# Patient Record
Sex: Female | Born: 1955 | Race: White | Hispanic: No | Smoking: Never smoker
Health system: Southern US, Community
[De-identification: ages and names within clinical notes are randomized; demographics above are authoritative.]

## PROBLEM LIST (undated history)

## (undated) DIAGNOSIS — N904 Leukoplakia of vulva: Secondary | ICD-10-CM

## (undated) DIAGNOSIS — N950 Postmenopausal bleeding: Secondary | ICD-10-CM

## (undated) DIAGNOSIS — L509 Urticaria, unspecified: Secondary | ICD-10-CM

## (undated) DIAGNOSIS — D219 Benign neoplasm of connective and other soft tissue, unspecified: Secondary | ICD-10-CM

## (undated) HISTORY — DX: Benign neoplasm of connective and other soft tissue, unspecified: D21.9

## (undated) HISTORY — DX: Leukoplakia of vulva: N90.4

## (undated) HISTORY — PX: WISDOM TOOTH EXTRACTION: SHX21

## (undated) HISTORY — DX: Urticaria, unspecified: L50.9

## (undated) HISTORY — PX: OTHER SURGICAL HISTORY: SHX169

---

## 1999-03-03 ENCOUNTER — Other Ambulatory Visit: Admission: RE | Admit: 1999-03-03 | Discharge: 1999-03-03 | Payer: Self-pay | Admitting: Obstetrics and Gynecology

## 2000-03-02 ENCOUNTER — Other Ambulatory Visit: Admission: RE | Admit: 2000-03-02 | Discharge: 2000-03-02 | Payer: Self-pay | Admitting: Obstetrics and Gynecology

## 2000-03-16 ENCOUNTER — Ambulatory Visit (HOSPITAL_COMMUNITY): Admission: RE | Admit: 2000-03-16 | Discharge: 2000-03-16 | Payer: Self-pay | Admitting: Obstetrics and Gynecology

## 2000-03-16 ENCOUNTER — Encounter: Payer: Self-pay | Admitting: Obstetrics and Gynecology

## 2001-03-06 ENCOUNTER — Other Ambulatory Visit: Admission: RE | Admit: 2001-03-06 | Discharge: 2001-03-06 | Payer: Self-pay | Admitting: Obstetrics and Gynecology

## 2001-04-24 ENCOUNTER — Encounter: Payer: Self-pay | Admitting: Obstetrics and Gynecology

## 2001-04-24 ENCOUNTER — Ambulatory Visit (HOSPITAL_COMMUNITY): Admission: RE | Admit: 2001-04-24 | Discharge: 2001-04-24 | Payer: Self-pay | Admitting: Obstetrics and Gynecology

## 2001-05-02 ENCOUNTER — Ambulatory Visit (HOSPITAL_BASED_OUTPATIENT_CLINIC_OR_DEPARTMENT_OTHER): Admission: RE | Admit: 2001-05-02 | Discharge: 2001-05-02 | Payer: Self-pay | Admitting: Orthopedic Surgery

## 2002-04-03 ENCOUNTER — Other Ambulatory Visit: Admission: RE | Admit: 2002-04-03 | Discharge: 2002-04-03 | Payer: Self-pay | Admitting: Obstetrics and Gynecology

## 2002-04-29 ENCOUNTER — Ambulatory Visit (HOSPITAL_COMMUNITY): Admission: RE | Admit: 2002-04-29 | Discharge: 2002-04-29 | Payer: Self-pay | Admitting: Obstetrics and Gynecology

## 2002-04-29 ENCOUNTER — Encounter: Payer: Self-pay | Admitting: Obstetrics and Gynecology

## 2003-04-22 ENCOUNTER — Other Ambulatory Visit: Admission: RE | Admit: 2003-04-22 | Discharge: 2003-04-22 | Payer: Self-pay | Admitting: Obstetrics and Gynecology

## 2003-05-12 ENCOUNTER — Ambulatory Visit (HOSPITAL_COMMUNITY): Admission: RE | Admit: 2003-05-12 | Discharge: 2003-05-12 | Payer: Self-pay | Admitting: Obstetrics and Gynecology

## 2004-05-18 ENCOUNTER — Other Ambulatory Visit: Admission: RE | Admit: 2004-05-18 | Discharge: 2004-05-18 | Payer: Self-pay | Admitting: Obstetrics and Gynecology

## 2004-07-12 ENCOUNTER — Ambulatory Visit (HOSPITAL_COMMUNITY): Admission: RE | Admit: 2004-07-12 | Discharge: 2004-07-12 | Payer: Self-pay | Admitting: Obstetrics and Gynecology

## 2005-06-09 ENCOUNTER — Other Ambulatory Visit: Admission: RE | Admit: 2005-06-09 | Discharge: 2005-06-09 | Payer: Self-pay | Admitting: Obstetrics and Gynecology

## 2005-12-05 ENCOUNTER — Ambulatory Visit (HOSPITAL_COMMUNITY): Admission: RE | Admit: 2005-12-05 | Discharge: 2005-12-05 | Payer: Self-pay | Admitting: Obstetrics and Gynecology

## 2006-12-10 ENCOUNTER — Ambulatory Visit (HOSPITAL_COMMUNITY): Admission: RE | Admit: 2006-12-10 | Discharge: 2006-12-10 | Payer: Self-pay | Admitting: Family Medicine

## 2007-12-11 ENCOUNTER — Ambulatory Visit (HOSPITAL_COMMUNITY): Admission: RE | Admit: 2007-12-11 | Discharge: 2007-12-11 | Payer: Self-pay | Admitting: Family Medicine

## 2008-12-14 ENCOUNTER — Ambulatory Visit (HOSPITAL_COMMUNITY): Admission: RE | Admit: 2008-12-14 | Discharge: 2008-12-14 | Payer: Self-pay | Admitting: Obstetrics and Gynecology

## 2009-07-29 ENCOUNTER — Encounter (INDEPENDENT_AMBULATORY_CARE_PROVIDER_SITE_OTHER): Payer: Self-pay | Admitting: *Deleted

## 2009-10-07 ENCOUNTER — Encounter (INDEPENDENT_AMBULATORY_CARE_PROVIDER_SITE_OTHER): Payer: Self-pay | Admitting: *Deleted

## 2009-10-08 ENCOUNTER — Ambulatory Visit: Payer: Self-pay | Admitting: Gastroenterology

## 2009-10-11 ENCOUNTER — Telehealth: Payer: Self-pay | Admitting: Gastroenterology

## 2009-10-25 ENCOUNTER — Ambulatory Visit: Payer: Self-pay | Admitting: Gastroenterology

## 2009-12-16 ENCOUNTER — Ambulatory Visit (HOSPITAL_COMMUNITY): Admission: RE | Admit: 2009-12-16 | Discharge: 2009-12-16 | Payer: Self-pay | Admitting: Obstetrics and Gynecology

## 2010-02-15 NOTE — Procedures (Signed)
Summary: Colonoscopy  Patient: [name] Note: All result statuses are Final unless otherwise noted.  Tests: (1) Colonoscopy (COL)   COL Colonoscopy           DONE     [facility]     [address]     [place], [place]  [address]           COLONOSCOPY PROCEDURE REPORT           PATIENT:  Natasha Simmons, Natasha Simmons  MR#:  [id number]     BIRTHDATE:  07-04-1955, 54 yrs. old  GENDER:  female           ENDOSCOPIST:  Barbette Hair. Arlyce Dice, MD     Referred by:           PROCEDURE DATE:  10/25/2009     PROCEDURE:  Diagnostic Colonoscopy     ASA CLASS:  Class I     INDICATIONS:  1) Routine Risk Screening           MEDICATIONS:   Fentanyl 100 mcg IV, Versed 10 mg IV, Benadryl 25     mg IV           DESCRIPTION OF PROCEDURE:   After the risks benefits and     alternatives of the procedure were thoroughly explained, informed     consent was obtained.  Digital rectal exam was performed and     revealed no abnormalities.   The LB CF-[id number] endoscope     was introduced through the anus and advanced to the cecum, which     was identified by both the appendix and ileocecal valve, without     limitations.  The quality of the prep was .  The instrument was     then slowly withdrawn as the colon was fully examined.     <<PROCEDUREIMAGES>>           FINDINGS:  Internal hemorrhoids were found (see image11).  This     was otherwise a normal examination of the colon (see image1,     image2, image3, image4, image7, image8, image9, and image10).     Retroflexed views in the rectum revealed no abnormalities.    The     time to cecum =  6.50  minutes. The scope was then withdrawn (time     =  6.25  min) from the patient and the procedure completed.           COMPLICATIONS:  None           ENDOSCOPIC IMPRESSION:     1) Internal hemorrhoids     2) Otherwise normal examination     RECOMMENDATIONS:     1) Continue current colorectal screening recommendations for     "routine risk" patients with  a repeat colonoscopy in 10 years.           REPEAT EXAM:  In 10 year(s) for Colonoscopy.           ______________________________     Barbette Hair. Arlyce Dice, MD           CC: Benedetto Goad, MD, Meredeth Ide, MD           n.     Rosalie Doctor:   Barbette Hair. Kaplan at 10/25/2009 04:07 PM           Karin Lieu, [id number]  Note: An exclamation mark (!) indicates a result that was not dispersed into the flowsheet. Document Creation Date: 10/25/2009 4:08 PM  _______________________________________________________________________  (1) Order result status: Final Collection or observation date-time: 10/25/2009 16:02 Requested date-time:  Receipt date-time:  Reported date-time:  Referring Physician:   Ordering Physician: Melvia Heaps [phone]) Specimen Source:  Source: Launa Grill Order Number: [id number] Lab site:   Appended Document: Colonoscopy    Clinical Lists Changes  Observations: Added new observation of COLONNXTDUE: 10/2019 (10/25/2009 16:15)

## 2010-02-15 NOTE — Miscellaneous (Signed)
Summary: [facility] Previsit/prep  Clinical Lists Changes  Medications: Added new medication of MOVIPREP 100 GM  SOLR (PEG-KCL-NACL-NASULF-NA ASC-C) As per prep instructions. - Signed Rx of MOVIPREP 100 GM  SOLR (PEG-KCL-NACL-NASULF-NA ASC-C) As per prep instructions.;  #1 x 0;  Signed;  Entered by: Wyona Almas RN;  Authorized by: Louis Meckel MD;  Method used: Electronically to [facility]  [phone]*, [address] [place], [place], [place]  [address], Ph: [phone] or [phone], Fax: [phone] Allergies: Added new allergy or adverse reaction of * SHRIMP Observations: Added new observation of NKA: F (10/08/2009 13:16)    Prescriptions: MOVIPREP 100 GM  SOLR (PEG-KCL-NACL-NASULF-NA ASC-C) As per prep instructions.  #1 x 0   Entered by:   Wyona Almas RN   Authorized by:   Louis Meckel MD   Signed by:   Wyona Almas RN on 10/08/2009   Method used:   Electronically to        [facility]  [phone]* (retail)       [address]       [place], [place]  [address]       Ph: [phone] or [phone]       Fax: [phone]   RxID:   [id number]

## 2010-02-15 NOTE — Progress Notes (Signed)
Summary: Reschedule pt  Phone Note Outgoing Call Call back at [facility] Phone [phone]   Call placed by: Merri Ray CMA Duncan Dull),  October 11, 2009 1:03 PM Summary of Call: Called pt to inform that we need to r/s her procedure for 10/22/2009 due to hospital case. Waiting on pt to call back to r/s Initial call taken by: Merri Ray CMA Duncan Dull),  October 11, 2009 1:03 PM  Follow-up for Phone Call        Called pt back and left another message for pt to return my call so we can get this pt rescheduled for Dr Nita Sells hospital case Follow-up by: Merri Ray CMA Duncan Dull),  October 12, 2009 9:23 AM  Additional Follow-up for Phone Call Additional follow up Details #1::        Pt returned call, resceduled pt for 10/25/2009 at 2:30pm. Went over new times with pt. Told pt to call back as needed  Additional Follow-up by: Merri Ray CMA Duncan Dull),  October 12, 2009 4:40 PM

## 2010-02-15 NOTE — Letter (Signed)
Summary: [facility] Instructions  [facility]  [address] [place], [place] [address]   Phone: [phone]  Fax: [phone]       Natasha Simmons    [birth year]    MRN: [id number]        Procedure Day Dorna Bloom:  Farrell Ours  10/22/09     Arrival Time:  1:00PM     Procedure Time:  2:00PM     Location of Procedure:                    _X _  [facility] (4th Floor)                       PREPARATION FOR COLONOSCOPY WITH MOVIPREP   Starting 5 days prior to your procedure 10/17/09 do not eat nuts, seeds, popcorn, corn, beans, peas,  salads, or any raw vegetables.  Do not take any fiber supplements (e.g. Metamucil, Citrucel, and Benefiber).  THE DAY BEFORE YOUR PROCEDURE         DATE: 10/21/09  DAY: THURSDAY  1.  Drink clear liquids the entire day-NO SOLID FOOD  2.  Do not drink anything colored red or purple.  Avoid juices with pulp.  No orange juice.  3.  Drink at least 64 oz. (8 glasses) of fluid/clear liquids during the day to prevent dehydration and help the prep work efficiently.  CLEAR LIQUIDS INCLUDE: Water Jello Ice Popsicles Tea (sugar ok, no milk/cream) Powdered fruit flavored drinks Coffee (sugar ok, no milk/cream) Gatorade Juice: apple, white grape, white cranberry  Lemonade Clear bullion, consomm, broth Carbonated beverages (any kind) Strained chicken noodle soup Hard Candy                             4.  In the morning, mix first dose of MoviPrep solution:    Empty 1 Pouch A and 1 Pouch B into the disposable container    Add lukewarm drinking water to the top line of the container. Mix to dissolve    Refrigerate (mixed solution should be used within 24 hrs)  5.  Begin drinking the prep at 5:00 p.m. The MoviPrep container is divided by 4 marks.   Every 15 minutes drink the solution down to the next mark (approximately 8 oz) until the full liter is complete.   6.  Follow completed prep with 16 oz of clear liquid of your choice (Nothing  red or purple).  Continue to drink clear liquids until bedtime.  7.  Before going to bed, mix second dose of MoviPrep solution:    Empty 1 Pouch A and 1 Pouch B into the disposable container    Add lukewarm drinking water to the top line of the container. Mix to dissolve    Refrigerate  THE DAY OF YOUR PROCEDURE      DATE: 10/22/09  DAY: FRIDAY  Beginning at 9:00AM (5 hours before procedure):         1. Every 15 minutes, drink the solution down to the next mark (approx 8 oz) until the full liter is complete.  2. Follow completed prep with 16 oz. of clear liquid of your choice.    3. You may drink clear liquids until 12:00PM (2 HOURS BEFORE PROCEDURE).   MEDICATION INSTRUCTIONS  Unless otherwise instructed, you should take regular prescription medications with a small sip of water   as early as possible the morning of  your procedure.        OTHER INSTRUCTIONS  You will need a responsible adult at least 53 years of age to accompany you and drive you home.   This person must remain in the waiting room during your procedure.  Wear loose fitting clothing that is easily removed.  Leave jewelry and other valuables at home.  However, you may wish to bring a book to read or  an iPod/MP3 player to listen to music as you wait for your procedure to start.  Remove all body piercing jewelry and leave at home.  Total time from sign-in until discharge is approximately 2-3 hours.  You should go home directly after your procedure and rest.  You can resume normal activities the  day after your procedure.  The day of your procedure you should not:   Drive   Make legal decisions   Operate machinery   Drink alcohol   Return to work  You will receive specific instructions about eating, activities and medications before you leave.    The above instructions have been reviewed and explained to me by   Wyona Almas RN  October 08, 2009 2:10 PM     I fully understand and  can verbalize these instructions _____________________________ Date _________

## 2010-02-15 NOTE — Letter (Signed)
Summary: Previsit letter  [facility]  [address] [place], [place] [address]   Phone: [phone]  Fax: [phone]       07/29/2009 MRN: [id number]  [facility] [address] [place], [place]  [address]  Dear Ms. Yaklin,  Welcome to the Gastroenterology Division at [facility].    You are scheduled to see a nurse for your pre-procedure visit on 09-16-09 at 4:00p.m. on the 3rd floor at [facility], [address]  We ask that you try to arrive at our office 15 minutes prior to your appointment time to allow for check-in.  Your nurse visit will consist of discussing your medical and surgical history, your immediate family medical history, and your medications.    Please bring a complete list of all your medications or, if you prefer, bring the medication bottles and we will list them.  We will need to be aware of both prescribed and over the counter drugs.  We will need to know exact dosage information as well.  If you are on blood thinners (Coumadin, Plavix, Aggrenox, Ticlid, etc.) please call our office today/prior to your appointment, as we need to consult with your physician about holding your medication.   Please be prepared to read and sign documents such as consent forms, a financial agreement, and acknowledgement forms.  If necessary, and with your consent, a friend or relative is welcome to sit-in on the nurse visit with you.  Please bring your insurance card so that we may make a copy of it.  If your insurance requires a referral to see a specialist, please bring your referral form from your primary care physician.  No co-pay is required for this nurse visit.     If you cannot keep your appointment, please call [phone] to cancel or reschedule prior to your appointment date.  This allows [place] the opportunity to schedule an appointment for another patient in need of care.    Thank you for choosing [facility] for your medical  needs.  We appreciate the opportunity to care for you.  Please visit [place] at our website  to learn more about our practice.                     Sincerely.                                                                                                                   The Gastroenterology Division

## 2010-06-03 NOTE — Op Note (Signed)
[place]  Patient:    Natasha Simmons, Natasha Simmons Visit Number: [id number] MRN: [id number]          Service Type: DSU Location: [facility] Attending Physician:  Colbert Ewing Dictated by:   Loreta Ave, M.D. Proc. Date: 05/02/01 Admit Date:  05/02/2001 Discharge Date: 05/02/2001                             Operative Report  PREOPERATIVE DIAGNOSIS:  Inflammatory reaction, right knee, with underlying degenerative arthritis.  POSTOPERATIVE DIAGNOSES: 1. Grade 3 and 4 degenerative arthritis, right knee, with degenerative    tearing, medial and lateral meniscus. 2. Inflammatory synovitis. 3. Intra-articular fibrosis and adhesions from previous operative intervention    done in an open manner in the past.  PROCEDURES: 1. Right knee examination under anesthesia and arthroscopy, debridement of    synovitis, intra-articular adhesions, and fibrosis. 2. Partial medial and lateral meniscectomy. 3. Diffuse chondroplasty, especially lateral compartment.  SURGEON:  Loreta Ave, M.D.  ASSISTANT:  Arlys John D. Petrarca, P.A.-C.  ANESTHESIA:  General.  ESTIMATED BLOOD LOSS:  Minimal.  TOURNIQUET:  Not employed.  SPECIMENS:  None.  CULTURES:  None.  COMPLICATIONS:  None.  DRESSING:  Soft compressive.  DESCRIPTION OF PROCEDURE:  The patient brought to the operating room and placed on the operating table in supine position.  After adequate anesthesia had been obtained, right knee examined.  Full extension, 120 degrees of flexion, good stability, good alignment.  Tourniquet and leg holder applied. Leg prepped and draped in the usual sterile fashion.  Three portals created, one superolateral, one each medial and lateral parapatellar.  Inflow cannula introduced, the knee distended, and arthroscope introduced and knee inspected. A fair amount of adhesions and fibrinous debris throughout the knee.  All of this debrided.  Some reactive synovitis, but  the inflammatory component was not great.  Chondral loose bodies throughout.  Patellofemoral joint grade 3 changes patella, treated with chondroplasty.  Trochlea had grade 3 and some grade 4 changes centrally.  Good tracking.  Chondroplasty to both sides.  The medial compartment had some degenerative tearing medial meniscus anterior and middle third, saucerized out.  Inflammatory tissue debrided.  Only grade 2 changes medial compartment.  Cruciate ligaments intact.  Lateral compartment extensive tearing, complex nature, entire lateral meniscus of what was left after previous partial lateral meniscectomy.  Treated with near-total lateral meniscectomy, as there was nothing viable left to retain.  Grade 3 and some focal grade 4 changes, especially posteriorly.  At completion, the entire knee examined and no other significant findings appreciated.  The instruments and fluid removed.  Portals and knee injected with Marcaine.  Portals closed with 4-0 nylon.  Sterile compressive dressing applied.  Anesthesia reversed, brought to the recovery room.  Tolerated the surgery well.  No complications. Dictated by:   Loreta Ave, M.D. Attending Physician:  Colbert Ewing DD:  05/02/01 TD:  05/04/01 Job: [id number]

## 2010-11-10 ENCOUNTER — Other Ambulatory Visit: Payer: Self-pay | Admitting: Obstetrics and Gynecology

## 2010-11-10 DIAGNOSIS — Z1231 Encounter for screening mammogram for malignant neoplasm of breast: Secondary | ICD-10-CM

## 2010-12-19 ENCOUNTER — Ambulatory Visit (HOSPITAL_COMMUNITY)
Admission: RE | Admit: 2010-12-19 | Discharge: 2010-12-19 | Disposition: A | Discharge status: Home / Self Care | Payer: BC Managed Care – PPO | Source: Ambulatory Visit | Attending: Obstetrics and Gynecology | Admitting: Obstetrics and Gynecology

## 2010-12-19 DIAGNOSIS — Z1231 Encounter for screening mammogram for malignant neoplasm of breast: Secondary | ICD-10-CM | POA: Insufficient documentation

## 2011-11-21 ENCOUNTER — Other Ambulatory Visit (HOSPITAL_COMMUNITY): Payer: Self-pay | Admitting: Family Medicine

## 2011-11-21 DIAGNOSIS — Z1231 Encounter for screening mammogram for malignant neoplasm of breast: Secondary | ICD-10-CM

## 2011-12-21 ENCOUNTER — Ambulatory Visit (HOSPITAL_COMMUNITY): Payer: BC Managed Care – PPO

## 2012-01-09 ENCOUNTER — Ambulatory Visit (HOSPITAL_COMMUNITY)
Admission: RE | Admit: 2012-01-09 | Discharge: 2012-01-09 | Disposition: A | Discharge status: Home / Self Care | Payer: BC Managed Care – PPO | Source: Ambulatory Visit | Attending: Family Medicine | Admitting: Family Medicine

## 2012-01-09 DIAGNOSIS — Z1231 Encounter for screening mammogram for malignant neoplasm of breast: Secondary | ICD-10-CM | POA: Insufficient documentation

## 2012-04-02 ENCOUNTER — Ambulatory Visit: Payer: BC Managed Care – PPO | Admitting: Rehabilitation

## 2012-04-11 ENCOUNTER — Ambulatory Visit: Payer: BC Managed Care – PPO | Admitting: Rehabilitation

## 2012-05-16 ENCOUNTER — Ambulatory Visit: Payer: BC Managed Care – PPO | Attending: Physician Assistant | Admitting: Rehabilitation

## 2012-05-16 DIAGNOSIS — R293 Abnormal posture: Secondary | ICD-10-CM | POA: Insufficient documentation

## 2012-05-16 DIAGNOSIS — IMO0001 Reserved for inherently not codable concepts without codable children: Secondary | ICD-10-CM | POA: Insufficient documentation

## 2012-05-16 DIAGNOSIS — M2560 Stiffness of unspecified joint, not elsewhere classified: Secondary | ICD-10-CM | POA: Insufficient documentation

## 2012-05-16 DIAGNOSIS — M2550 Pain in unspecified joint: Secondary | ICD-10-CM | POA: Insufficient documentation

## 2012-05-23 ENCOUNTER — Ambulatory Visit: Payer: BC Managed Care – PPO | Admitting: Rehabilitation

## 2012-05-27 ENCOUNTER — Ambulatory Visit: Payer: BC Managed Care – PPO | Admitting: Physical Therapy

## 2012-05-30 ENCOUNTER — Ambulatory Visit: Payer: BC Managed Care – PPO | Admitting: Physical Therapy

## 2012-06-03 ENCOUNTER — Ambulatory Visit: Payer: BC Managed Care – PPO | Admitting: Physical Therapy

## 2012-06-05 ENCOUNTER — Ambulatory Visit: Payer: BC Managed Care – PPO | Admitting: Physical Therapy

## 2012-06-11 ENCOUNTER — Ambulatory Visit: Payer: BC Managed Care – PPO | Admitting: Rehabilitation

## 2012-06-13 ENCOUNTER — Encounter: Payer: BC Managed Care – PPO | Admitting: Rehabilitation

## 2012-07-26 ENCOUNTER — Other Ambulatory Visit: Payer: Self-pay | Admitting: Obstetrics and Gynecology

## 2012-07-29 ENCOUNTER — Telehealth: Payer: Self-pay | Admitting: *Deleted

## 2012-07-29 MED ORDER — ESTRADIOL 0.1 MG/24HR TD PTTW: 1.0000 | MEDICATED_PATCH | TRANSDERMAL | Status: DC

## 2012-07-29 NOTE — Telephone Encounter (Signed)
last aex-08/01/2011  next aex is on 08/05/2012. Refill on minivelle 0.1mg  patch #8/0 refills sent to pharmacy to maintain coverage until next aex.

## 2012-07-30 ENCOUNTER — Encounter: Payer: Self-pay | Admitting: Obstetrics and Gynecology

## 2012-08-05 ENCOUNTER — Encounter: Payer: Self-pay | Admitting: Obstetrics and Gynecology

## 2012-08-05 ENCOUNTER — Ambulatory Visit (INDEPENDENT_AMBULATORY_CARE_PROVIDER_SITE_OTHER): Payer: BC Managed Care – PPO | Admitting: Obstetrics and Gynecology

## 2012-08-05 VITALS — BP 112/58 | HR 76 | Resp 18 | Ht 65.0 in | Wt 129.0 lb

## 2012-08-05 DIAGNOSIS — Z01419 Encounter for gynecological examination (general) (routine) without abnormal findings: Secondary | ICD-10-CM

## 2012-08-05 DIAGNOSIS — N950 Postmenopausal bleeding: Secondary | ICD-10-CM

## 2012-08-05 DIAGNOSIS — Z0000 Encounter for general adult medical examination without abnormal findings: Secondary | ICD-10-CM

## 2012-08-05 LAB — POCT URINALYSIS DIPSTICK
Ketones, UA: NEGATIVE
Nitrite, UA: NEGATIVE

## 2012-08-05 MED ORDER — ESTRADIOL 0.1 MG/24HR TD PTTW: 1.0000 | MEDICATED_PATCH | TRANSDERMAL | Status: DC

## 2012-08-05 MED ORDER — PROGESTERONE MICRONIZED 100 MG PO CAPS: 100.0000 mg | ORAL_CAPSULE | Freq: Every day | ORAL | Status: DC

## 2012-08-05 NOTE — Patient Instructions (Signed)

## 2012-08-05 NOTE — Progress Notes (Signed)
57 y.o.   Divorced    Caucasian   female   G0P0000   here for annual exam.  Started on HRT last year d/t hot flashes and night sweats.  Having irregular bleeding, sometimes q2 weeks, sometimes lasting 2 weeks, sometimes heavy.    Patient's last menstrual period was 07/21/2012.          Sexually active: yes  The current method of family planning is none.    Exercising: aerobics, pilates 3-5 days a week, fitness walking, wt training Last mammogram: 11/2011 normal  Last pap smear:07/20/09 neg History of abnormal pap: no Smoking: no Alcohol: occ glass of wine Last colonoscopy:10/2009 normal, repeat in 10 years Last Bone Density:  never Last tetanus shot: 2007 Last cholesterol check: not sure  Hgb:  12.8              Urine: neg   Family History  Problem Relation Age of Onset  . Thyroid disease Mother   . Hypertension Mother   . Diabetes Father   . Heart disease Father   . Liver disease Father   . Lymphoma Father     There are no active problems to display for this patient.   History reviewed. No pertinent past medical history.  Past Surgical History  Procedure Laterality Date  . Knee surgery  1976; [phone]    6 knee surgeries total    Allergies: Shrimp  Current Outpatient Prescriptions  Medication Sig Dispense Refill  . cholecalciferol (VITAMIN D) 1000 UNITS tablet Take 1,000 Units by mouth daily.      Marland Kitchen MINIVELLE 0.1 MG/24HR APPLY 1 PATCH TWICE WEEKLY  8 patch  11  . Multiple Vitamins-Minerals (MULTIVITAMIN PO) Take by mouth daily.       . Omega-3 Fatty Acids (FISH OIL PO) Take by mouth.      . progesterone (PROMETRIUM) 100 MG capsule        No current facility-administered medications for this visit.    ROS: Pertinent items are noted in HPI.  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  Exam:    BP 112/58  Pulse 76  Resp 18  Ht 5\' 5"  (1.651 m)  Wt 129 lb (58.514 kg)  BMI 21.47 kg/m2  LMP 07/21/2012  Ht stable and wt  down 4 pounds from last year Wt Readings from Last 3 Encounters:  08/05/12 129 lb (58.514 kg)     Ht Readings from Last 3 Encounters:  08/05/12 5\' 5"  (1.651 m)    General appearance: alert, cooperative and appears stated age Head: Normocephalic, without obvious abnormality, atraumatic Neck: no adenopathy, supple, symmetrical, trachea midline and thyroid not enlarged, symmetric, no tenderness/mass/nodules Lungs: clear to auscultation bilaterally Breasts: Inspection negative, No nipple retraction or dimpling, No nipple discharge or bleeding, No axillary or supraclavicular adenopathy, Normal to palpation without dominant masses Heart: regular rate and rhythm Abdomen: soft, non-tender; bowel sounds normal; no masses,  no organomegaly Extremities: extremities normal, atraumatic, no cyanosis or edema Skin: Skin color, texture, turgor normal. No rashes or lesions Lymph nodes: Cervical, supraclavicular, and axillary nodes normal. No abnormal inguinal nodes palpated Neurologic: Grossly normal   Pelvic: External genitalia:  no lesions              Urethra:  normal appearing urethra with no masses, tenderness or lesions              Bartholins and Skenes: normal  Vagina: normal appearing vagina with normal color and discharge, no lesions              Cervix: normal appearance              Pap taken: yes        Bimanual Exam:  Uterus:  uterus is normal size, shape, consistency and nontender, RF, fundus feels soft and boggy                                      Adnexa: normal adnexa in size, nontender and no masses                                      Rectovaginal: Confirms                                      Anus:  normal sphincter tone, no lesions  A: normal menopausal exam, on HRT, with irregular bleeding, boggy fundus, ? adenomyosis  P: mammogram pap smear counseled on breast self exam, mammography screening, adequate intake of calcium and vitamin D, diet and  exercise return annually or prn   Check PUS/SHSG, check FLP, rf HRT but pt aware not to pick it up until after her PUS in case we decide to change it based on the results.     An After Visit Summary was printed and given to the patient.

## 2012-08-06 LAB — HEMOGLOBIN, FINGERSTICK: Hemoglobin, fingerstick: 12.8 g/dL (ref 12.0–16.0)

## 2012-08-06 LAB — LIPID PANEL
HDL: 54 mg/dL (ref >39)
Total CHOL/HDL Ratio: 2.6 Ratio

## 2012-08-07 ENCOUNTER — Telehealth: Payer: Self-pay | Admitting: Obstetrics and Gynecology

## 2012-08-07 NOTE — Telephone Encounter (Signed)
Patient calling re: scheduling an ultrasound as soon as possible due to husband having 40th surgery 08/14/12. She will need to be home with her husband and hopes to see [place] before then.

## 2012-08-07 NOTE — Telephone Encounter (Signed)
Pt returning Sally's call.

## 2012-08-07 NOTE — Telephone Encounter (Signed)
LMTCB

## 2012-08-07 NOTE — Telephone Encounter (Signed)
Call back to patient and scheduled SHGM for 08-20-12.  Unable to get earlier appointment.

## 2012-08-08 LAB — IPS PAP TEST WITH HPV

## 2012-08-20 ENCOUNTER — Telehealth: Payer: Self-pay | Admitting: Obstetrics and Gynecology

## 2012-08-20 ENCOUNTER — Ambulatory Visit (INDEPENDENT_AMBULATORY_CARE_PROVIDER_SITE_OTHER): Payer: BC Managed Care – PPO | Admitting: Obstetrics and Gynecology

## 2012-08-20 ENCOUNTER — Ambulatory Visit (INDEPENDENT_AMBULATORY_CARE_PROVIDER_SITE_OTHER): Payer: BC Managed Care – PPO

## 2012-08-20 ENCOUNTER — Encounter: Payer: Self-pay | Admitting: Obstetrics and Gynecology

## 2012-08-20 VITALS — BP 102/60 | HR 74 | Resp 16 | Wt 131.2 lb

## 2012-08-20 DIAGNOSIS — N950 Postmenopausal bleeding: Secondary | ICD-10-CM

## 2012-08-20 DIAGNOSIS — R9389 Abnormal findings on diagnostic imaging of other specified body structures: Secondary | ICD-10-CM

## 2012-08-20 DIAGNOSIS — D259 Leiomyoma of uterus, unspecified: Secondary | ICD-10-CM

## 2012-08-20 NOTE — Telephone Encounter (Signed)
Patient scheduled for PUS today @ 3:30pm Patient scheduled to follow up with Dr. Tresa Res @ 4:00pm. Patient instructed to be sure to take Ibuprophen one hour before procedure and to have a snack with high protein and carbohydrate intake. Patient understands instructions.

## 2012-08-20 NOTE — Patient Instructions (Signed)
We will call you  at the end of this week or the beginning of next week to schedule your D & C .

## 2012-08-20 NOTE — Telephone Encounter (Signed)
Patient called in today to ask what her instructions are for preparing for her ultra sound ? Her appointment is at 4:00 pm.

## 2012-08-20 NOTE — Progress Notes (Signed)
57 yo DWF G0P0 with menopausal bleeding, here for evaluation.  PUS shows:    Discussed findings with patient and rec: hysteroscopic resection of polyp and D & C.   Procedure discussed, pt saw video, questions answered, and consent signed.  Will schedule.

## 2012-08-21 ENCOUNTER — Other Ambulatory Visit: Payer: Self-pay | Admitting: Obstetrics and Gynecology

## 2012-08-22 ENCOUNTER — Telehealth: Payer: Self-pay | Admitting: *Deleted

## 2012-08-22 NOTE — Telephone Encounter (Signed)
Call tp patient to discuss date preferences for surgery.  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  Prefers week of 09-03-12 but will do 09-09-12 if needed. Will schedule and call her back.

## 2012-08-23 NOTE — Telephone Encounter (Signed)
Patient returning your phone call.  Please call before scheduling the 19 is not a good day after all . School starting that day.

## 2012-08-23 NOTE — Telephone Encounter (Signed)
Return call to patient and she states she with start of school, she does not want 09-03-12 and thinks she would like to proceed with surgery with new physician since Dr Tresa Res retiring.  Also wants to be able to talk with new MD about pros/cons for continuing HRT after results of surgery.  Discussed scheduling procedure and then will schedule preop with Dr Edward Jolly. Agreeable.

## 2012-08-27 ENCOUNTER — Encounter (HOSPITAL_COMMUNITY): Payer: Self-pay | Admitting: Obstetrics and Gynecology

## 2012-09-02 ENCOUNTER — Telehealth: Payer: Self-pay | Admitting: *Deleted

## 2012-09-02 ENCOUNTER — Encounter: Payer: Self-pay | Admitting: Obstetrics and Gynecology

## 2012-09-02 NOTE — Telephone Encounter (Signed)
Calling patient to review options for surgery. LMTCB on both numbers.

## 2012-09-02 NOTE — H&P (Addendum)
57 y.o. Divorced Caucasian female  G0P0000 here for hysteroscopy, removal of endometrial mass, and D & C.  . Started on HRT last year d/t hot flashes and night sweats. Having irregular bleeding, sometimes q2 weeks, sometimes lasting 2 weeks, sometimes heavy. Pt had an ultrasound August 5th, 2014 which showed her uterus was 10 x 6 x 5 cm with 2 small intramural fibroids and nl ovaries and SHSG showed an 18 mm endometrial mass c/w a polyp.    Sexually active: yes  The current method of family planning is none.  Exercising: aerobics, pilates 3-5 days a week, fitness walking, wt training  Last mammogram: 11/2011 normal  Last pap smear:07/20/09 neg  History of abnormal pap: no  Smoking: no  Alcohol: occ glass of wine  Last colonoscopy:10/2009 normal, repeat in 10 years  Last Bone Density: never  Last tetanus shot: 2007  Last cholesterol check: not sure  Hgb: 12.8 Urine: neg   Family History   Problem  Relation  Age of Onset   .  Thyroid disease  Mother    .  Hypertension  Mother    .  Diabetes  Father    .  Heart disease  Father    .  Liver disease  Father    .  Lymphoma  Father    There are no active problems to display for this patient.  History reviewed. No pertinent past medical history.  Past Surgical History   Procedure  Laterality  Date   .  Knee surgery   1976; [phone]       6 knee surgeries total   Allergies: Shrimp    Current Outpatient Prescriptions   Medication  Sig  Dispense  Refill   .  cholecalciferol (VITAMIN D) 1000 UNITS tablet  Take 1,000 Units by mouth daily.     Marland Kitchen  MINIVELLE 0.1 MG/24HR  APPLY 1 PATCH TWICE WEEKLY  8 patch  11   .  Multiple Vitamins-Minerals (MULTIVITAMIN PO)  Take by mouth daily.     .  Omega-3 Fatty Acids (FISH OIL PO)  Take by mouth.     .  progesterone (PROMETRIUM) 100 MG capsule         ROS: Pertinent items are noted in HPI.    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    Exam:   General  appearance: alert, cooperative and appears stated age  Head: Normocephalic, without obvious abnormality, atraumatic  Neck: no adenopathy, supple, symmetrical, trachea midline and thyroid not enlarged, symmetric, no tenderness/mass/nodules  Lungs: clear to auscultation bilaterally  Breasts: Inspection negative, No nipple retraction or dimpling, No nipple discharge or bleeding, No axillary or supraclavicular adenopathy, Normal to palpation without dominant masses  Heart: regular rate and rhythm  Abdomen: soft, non-tender; bowel sounds normal; no masses, no organomegaly  Extremities: extremities normal, atraumatic, no cyanosis or edema  Skin: Skin color, texture, turgor normal. No rashes or lesions  Lymph nodes: Cervical, supraclavicular, and axillary nodes normal.  No abnormal inguinal nodes palpated  Neurologic: Grossly normal  Pelvic: External genitalia: no lesions  Urethra: normal appearing urethra with no masses, tenderness or lesions  Bartholins and Skenes: normal  Vagina: normal appearing vagina with normal color and discharge, no lesions  Cervix: normal appearance  Pap taken: yes  Bimanual Exam: Uterus: uterus is normal size, shape, consistency and nontender, RF, fundus feels soft and boggy  Adnexa: normal adnexa in size, nontender and no masses  Rectovaginal: Confirms  Anus: normal sphincter tone, no lesions   A: normal menopausal exam, on HRT, with irregular bleeding, boggy fundus, ? Adenomyosis, endometrial mass on SHSG  P: Hysteroscopic resection of polyp, D & C.

## 2012-09-03 ENCOUNTER — Telehealth: Payer: Self-pay | Admitting: *Deleted

## 2012-09-03 NOTE — Telephone Encounter (Signed)
Spoke to patient yesterday afternoon.  She prefers to have surgery with Dr Edward Jolly unless it would be a long wait.  Wants to have a "smooth transition" to Dr Edward Jolly while Dr Tresa Res still here.  Call to patient this am to advise of new surgery date and LMTCB.

## 2012-09-03 NOTE — Telephone Encounter (Signed)
Left message on VM at home with surgery date of 09-24-12 1145 with Dr Edward Jolly at [facility].  Will need to schedule pre/post op appointments and review surgery instructions when patient calls back.

## 2012-09-03 NOTE — Telephone Encounter (Signed)
Patient returned call, discussed surgery currently scheduled for 09-24-12 at 1145.  Possible option of moving up to 09-11-12 and patient would like to do this if possible.  Surgery instructions reviewed.  Consult scheduled for 09-06-12 with Dr Edward Jolly. If 09-11-12 becomes available, will move her up.

## 2012-09-04 NOTE — Telephone Encounter (Signed)
LMTCB about surgery date change.

## 2012-09-05 NOTE — Telephone Encounter (Signed)
Spoke with Natasha Simmons about the surgery date change and her prepayment amount that id due at her pre-op appointment tomorrow. She agreed to the amount and explained that the hospital already called to inform her what the surgery date change and what time they would be expecting her.

## 2012-09-05 NOTE — Telephone Encounter (Addendum)
Patient calling you back. She said you could call her back 12:30 or after. Please call her on her work phone  [phone] Ext.[phone] . Patient said you could leave message.

## 2012-09-06 ENCOUNTER — Ambulatory Visit (INDEPENDENT_AMBULATORY_CARE_PROVIDER_SITE_OTHER): Payer: BC Managed Care – PPO | Admitting: Obstetrics and Gynecology

## 2012-09-06 VITALS — BP 100/60 | HR 70 | Ht 65.0 in | Wt 133.0 lb

## 2012-09-06 DIAGNOSIS — N950 Postmenopausal bleeding: Secondary | ICD-10-CM

## 2012-09-06 DIAGNOSIS — N9489 Other specified conditions associated with female genital organs and menstrual cycle: Secondary | ICD-10-CM

## 2012-09-06 NOTE — Progress Notes (Signed)
Patient ID: Natasha Simmons, female   DOB: 1955/04/12, 57 y.o.   MRN: [id number]  57 y.o.   Divorced    Caucasian   female   G0P0000   here for discussion of postmenopausal bleeding and surgery. Patient is transferring her care from Dr. Meredeth Ide, who is retiring.  Patient took OCPs for many years and began having abnormal uterine bleeding in fall 2013. Transitioned to hormone therapy at that time, but bleeding persisted. Had pelvic ultrasound and sonohysterogram on 08/20/12 showing 2 small intramural fibroids, one measuring 0.91 cm. Endometrial mass measured 1.8 cm and contained a feeder vessel on the anterior wall. Ovaries normal.  Patient desires surgery and follow up discussion regarding hormone therapy after surgery is complete.  Patient's last menstrual period was 08/20/2012.          Sexually active: yes  The current method of family planning is none.    Last mammogram:  11/2011 = normal Last pap smear:  08/05/2012 = normal, negative high risk HPV. History of abnormal pap:  Smoking:  no Alcohol:  rare wine.      Family History  Problem Relation Age of Onset  . Thyroid disease Mother   . Hypertension Mother   . Diabetes Father   . Heart disease Father   . Liver disease Father   . Lymphoma Father     There are no active problems to display for this patient.   No past medical history on file.  Past Surgical History  Procedure Laterality Date  . Knee surgery  1976; [phone]    6 knee surgeries total    Allergies: Shrimp  Current Outpatient Prescriptions  Medication Sig Dispense Refill  . cholecalciferol (VITAMIN D) 1000 UNITS tablet Take 1,000 Units by mouth daily.      Marland Kitchen estradiol (MINIVELLE) 0.1 MG/24HR Place 1 patch (0.1 mg total) onto the skin 2 (two) times a week.  8 patch  11  . Multiple Vitamins-Minerals (MULTIVITAMIN PO) Take by mouth daily.       . Omega-3 Fatty Acids (FISH OIL PO) Take by mouth.      . progesterone (PROMETRIUM) 100 MG capsule  Take 1 capsule (100 mg total) by mouth daily.  30 capsule  11   No current facility-administered medications for this visit.    ROS: Pertinent items are noted in HPI.  Social Hx:  From [place] originally.  Lifetime partner of 20 years.  He has had a below the knee amputation due to a logging accident.  Patient is a physical Automotive engineer.    Exam:    BP 100/60  Pulse 70  Ht 5\' 5"  (1.651 m)  Wt 133 lb (60.328 kg)  BMI 22.13 kg/m2  LMP 08/20/2012   Wt Readings from Last 3 Encounters:  09/06/12 133 lb (60.328 kg)  08/20/12 131 lb 3.2 oz (59.512 kg)  08/05/12 129 lb (58.514 kg)     Ht Readings from Last 3 Encounters:  09/06/12 5\' 5"  (1.651 m)  08/05/12 5\' 5"  (1.651 m)    General appearance: alert, cooperative and appears stated age.  Patient tearful when discussing health issues for herself and her partner. Head: Normocephalic, without obvious abnormality, atraumatic Neck: no adenopathy, supple, symmetrical, trachea midline and thyroid not enlarged, symmetric, no tenderness/mass/nodules Lungs: clear to auscultation bilaterally Breasts: Inspection negative, No nipple retraction or dimpling, No nipple discharge or bleeding, No axillary or supraclavicular adenopathy, Normal to palpation without dominant masses Heart: regular rate and rhythm Abdomen: soft, non-tender; no  masses,  no organomegaly Extremities: extremities normal, atraumatic, no cyanosis or edema Skin: Skin color, texture, turgor normal. No rashes or lesions Lymph nodes: Cervical, supraclavicular, and axillary nodes normal. No abnormal inguinal nodes palpated Neurologic: Grossly normal   Pelvic: External genitalia:  no lesions              Urethra:  normal appearing urethra with no masses, tenderness or lesions              Bartholins and Skenes: normal                 Vagina: normal appearing vagina with normal color and discharge, no lesions              Cervix: normal appearance                   Bimanual  Exam:  Uterus:  uterus is normal size, shape, consistency and nontender                                      Adnexa: normal adnexa in size, nontender and no masses                                      Rectovaginal: Confirms                                      Anus:  normal sphincter tone, no lesions  Assessment  Postmenopausal bleeding. Endometrial thickening and mass, with suspicion for a polyp. Hormone therapy patient.  Plan  Proceed with hysteroscopy with polypectomy and dilation and curettage.  Risks, benefits, and alternatives discussed with the patient who wishes to proceed.  Patient understands risks include but are not limited to bleeding, infection, damage to surrounding organs, uterine perforation, adhesions inside the uterine cavity, hyponatremia, pulmonary edema, incompleteness of procedure requiring reoperation, reactions to anesthesia, DVT, PE, and death.   An After Visit Summary was printed and given to the patient.

## 2012-09-07 ENCOUNTER — Encounter: Payer: Self-pay | Admitting: Obstetrics and Gynecology

## 2012-09-09 NOTE — Telephone Encounter (Signed)
Call back to patient and left message that surgery date is indeed this week on 09-11-12 at 0730. Paper work was written with original date before it was moved up.  Can call back anytime and have anyone help her reschedule post op.

## 2012-09-09 NOTE — Telephone Encounter (Signed)
Patient returning your call . Please call her back on her work phone [phone] Ext [phone] .

## 2012-09-09 NOTE — Telephone Encounter (Signed)
Patient confused about surgery date. Paperwork stated a different date than she had thought. Please clarify? Patient also wants to reschedule her post op appointment at the same time please.

## 2012-09-10 ENCOUNTER — Encounter (HOSPITAL_COMMUNITY): Payer: Self-pay | Admitting: Pharmacy Technician

## 2012-09-10 NOTE — Telephone Encounter (Signed)
Confirmed surgery with patient for tomorrow and she has rescheduled her post op as well.  Was concerned because she received a call regarding medication verification but has not been able to get back in touch with them.  Advised I have talked with Marylu Lund in preop and confirmed all set for am, pharm may have called her as they sometimes do.   Patient wants both Dr Tresa Res and Dr Edward Jolly to know that her visit with Dr Edward Jolly went very well and she is thankful for both of you for all you have done to make this transition easier for her.

## 2012-09-10 NOTE — H&P (Signed)
Natasha Simmons  09/06/2012 3:00 PM   Office Visit  MRN:  [id number]   Description: 57 year old female  Provider: Melony Overly, MD  Department: [facility]      Diagnoses    Postmenopausal bleeding    -  Primary    627.1    Endometrial mass        625.8      Reason for Visit    Discuss surgery       Current Vitals - Last Recorded    BP Pulse Ht Wt BMI LMP    100/60 70 5\' 5"  (1.651 m) 133 lb (60.328 kg) 22.13 kg/m2 08/20/2012       Vitals History Recorded     Progress Notes    Melony Overly, MD at 09/07/2012  7:03 AM    Status: Signed                   Patient ID: Natasha Simmons, female   DOB: Mar 29, 1955, 57 y.o.   MRN: [id number]  57 y.o.   Divorced    Caucasian   female    G0P0000   here for discussion of postmenopausal bleeding and surgery. Patient is transferring her care from Dr. Meredeth Ide, who is retiring.  Patient took OCPs for many years and began having abnormal uterine bleeding in fall 2013. Transitioned to hormone therapy at that time, but bleeding persisted. Had pelvic ultrasound and sonohysterogram on 08/20/12 showing 2 small intramural fibroids, one measuring 0.91 cm. Endometrial mass measured 1.8 cm and contained a feeder vessel on the anterior wall. Ovaries normal.  Patient desires surgery and follow up discussion regarding hormone therapy after surgery is complete.  Patient's last menstrual period was 08/20/2012.           Sexually active: yes   The current method of family planning is none.     Last mammogram:  11/2011 = normal Last pap smear:  08/05/2012 = normal, negative high risk HPV. History of abnormal pap:   Smoking:  no Alcohol:  rare wine.         Family History   Problem  Relation  Age of Onset   .  Thyroid disease  Mother     .  Hypertension  Mother     .  Diabetes  Father     .  Heart disease  Father     .  Liver disease  Father     .  Lymphoma  Father       There are no active problems to display for this  patient.   No past medical history on file.    Past Surgical History   Procedure  Laterality  Date   .  Knee surgery    1976; [phone]       6 knee surgeries total     Allergies: Shrimp    Current Outpatient Prescriptions   Medication  Sig  Dispense  Refill   .  cholecalciferol (VITAMIN D) 1000 UNITS tablet  Take 1,000 Units by mouth daily.         Marland Kitchen  estradiol (MINIVELLE) 0.1 MG/24HR  Place 1 patch (0.1 mg total) onto the skin 2 (two) times a week.   8 patch   11   .  Multiple Vitamins-Minerals (MULTIVITAMIN PO)  Take by mouth daily.          .  Omega-3 Fatty Acids (FISH OIL PO)  Take by mouth.         Marland Kitchen  progesterone (PROMETRIUM) 100 MG capsule  Take 1 capsule (100 mg total) by mouth daily.   30 capsule   11      No current facility-administered medications for this visit.     ROS: Pertinent items are noted in HPI.  Social Hx:  From [place] originally.  Lifetime partner of 20 years.  He has had a below the knee amputation due to a logging accident.  Patient is a physical Automotive engineer.    Exam:    BP 100/60  Pulse 70  Ht 5\' 5"  (1.651 m)  Wt 133 lb (60.328 kg)  BMI 22.13 kg/m2  LMP 08/20/2012    Wt Readings from Last 3 Encounters:   09/06/12  133 lb (60.328 kg)   08/20/12  131 lb 3.2 oz (59.512 kg)   08/05/12  129 lb (58.514 kg)       Ht Readings from Last 3 Encounters:   09/06/12  5\' 5"  (1.651 m)   08/05/12  5\' 5"  (1.651 m)     General appearance: alert, cooperative and appears stated age.  Patient tearful when discussing health issues for herself and her partner. Head: Normocephalic, without obvious abnormality, atraumatic Neck: no adenopathy, supple, symmetrical, trachea midline and thyroid not enlarged, symmetric, no tenderness/mass/nodules Lungs: clear to auscultation bilaterally Breasts: Inspection negative, No nipple retraction or dimpling, No nipple discharge or bleeding, No axillary or supraclavicular adenopathy, Normal to palpation without dominant  masses Heart: regular rate and rhythm Abdomen: soft, non-tender; no masses,  no organomegaly Extremities: extremities normal, atraumatic, no cyanosis or edema Skin: Skin color, texture, turgor normal. No rashes or lesions Lymph nodes: Cervical, supraclavicular, and axillary nodes normal. No abnormal inguinal nodes palpated Neurologic: Grossly normal   Pelvic: External genitalia:  no lesions              Urethra:  normal appearing urethra with no masses, tenderness or lesions              Bartholins and Skenes: normal                  Vagina: normal appearing vagina with normal color and discharge, no lesions              Cervix: normal appearance                    Bimanual Exam:  Uterus:  uterus is normal size, shape, consistency and nontender                                      Adnexa: normal adnexa in size, nontender and no masses                                      Rectovaginal: Confirms                                      Anus:  normal sphincter tone, no lesions  Assessment  Postmenopausal bleeding. Endometrial thickening and mass, with suspicion for a polyp. Hormone therapy patient.  Plan  Proceed with hysteroscopy with polypectomy and dilation and curettage.  Risks, benefits, and alternatives discussed with the patient who wishes to proceed.  Patient understands risks include but are not limited to  bleeding, infection, damage to surrounding organs, uterine perforation, adhesions inside the uterine cavity, hyponatremia, pulmonary edema, incompleteness of procedure requiring reoperation, reactions to anesthesia, DVT, PE, and death.   An After Visit Summary was printed and given to the patient.

## 2012-09-11 ENCOUNTER — Ambulatory Visit (HOSPITAL_COMMUNITY): Payer: BC Managed Care – PPO | Admitting: Anesthesiology

## 2012-09-11 ENCOUNTER — Encounter (HOSPITAL_COMMUNITY): Payer: Self-pay | Admitting: Anesthesiology

## 2012-09-11 ENCOUNTER — Ambulatory Visit (HOSPITAL_COMMUNITY)
Admission: RE | Admit: 2012-09-11 | Discharge: 2012-09-11 | Disposition: A | Discharge status: Home / Self Care | Payer: BC Managed Care – PPO | Source: Ambulatory Visit | Attending: Obstetrics and Gynecology | Admitting: Obstetrics and Gynecology

## 2012-09-11 ENCOUNTER — Encounter (HOSPITAL_COMMUNITY): Payer: Self-pay | Admitting: *Deleted

## 2012-09-11 ENCOUNTER — Encounter (HOSPITAL_COMMUNITY)
Admission: RE | Disposition: A | Discharge status: Home / Self Care | Payer: Self-pay | Source: Ambulatory Visit | Attending: Obstetrics and Gynecology

## 2012-09-11 DIAGNOSIS — N840 Polyp of corpus uteri: Secondary | ICD-10-CM | POA: Insufficient documentation

## 2012-09-11 DIAGNOSIS — N950 Postmenopausal bleeding: Secondary | ICD-10-CM | POA: Insufficient documentation

## 2012-09-11 DIAGNOSIS — R9389 Abnormal findings on diagnostic imaging of other specified body structures: Secondary | ICD-10-CM | POA: Insufficient documentation

## 2012-09-11 DIAGNOSIS — D251 Intramural leiomyoma of uterus: Secondary | ICD-10-CM | POA: Insufficient documentation

## 2012-09-11 DIAGNOSIS — N9489 Other specified conditions associated with female genital organs and menstrual cycle: Secondary | ICD-10-CM | POA: Insufficient documentation

## 2012-09-11 HISTORY — PX: DILATATION & CURRETTAGE/HYSTEROSCOPY WITH RESECTOCOPE: SHX5572

## 2012-09-11 LAB — CBC
Hemoglobin: 13.1 g/dL (ref 12.0–15.0)
MCH: 31.6 pg (ref 26.0–34.0)
MCHC: 34.2 g/dL (ref 30.0–36.0)

## 2012-09-11 SURGERY — DILATATION & CURETTAGE/HYSTEROSCOPY WITH RESECTOCOPE
Anesthesia: General | Site: Vagina | Wound class: Clean Contaminated

## 2012-09-11 MED ORDER — DEXAMETHASONE SODIUM PHOSPHATE 10 MG/ML IJ SOLN
INTRAMUSCULAR | Status: DC | PRN
  Administered 2012-09-11: 10 mg via INTRAVENOUS

## 2012-09-11 MED ORDER — FENTANYL CITRATE 0.05 MG/ML IJ SOLN: 25.0000 ug | INTRAMUSCULAR | Status: DC | PRN

## 2012-09-11 MED ORDER — KETOROLAC TROMETHAMINE 30 MG/ML IJ SOLN
INTRAMUSCULAR | Status: DC | PRN
  Administered 2012-09-11: 30 mg via INTRAVENOUS

## 2012-09-11 MED ORDER — GLYCINE 1.5 % IR SOLN
Status: DC | PRN
  Administered 2012-09-11: 3000 mL

## 2012-09-11 MED ORDER — FENTANYL CITRATE 0.05 MG/ML IJ SOLN
INTRAMUSCULAR | Status: DC | PRN
  Administered 2012-09-11: 100 ug via INTRAVENOUS

## 2012-09-11 MED ORDER — ONDANSETRON HCL 4 MG/2ML IJ SOLN
INTRAMUSCULAR | Status: DC | PRN
  Administered 2012-09-11: 4 mg via INTRAVENOUS

## 2012-09-11 MED ORDER — LACTATED RINGERS IV SOLN: INTRAVENOUS | Status: DC

## 2012-09-11 MED ORDER — FENTANYL CITRATE 0.05 MG/ML IJ SOLN
INTRAMUSCULAR | Status: AC
  Filled 2012-09-11: qty 2

## 2012-09-11 MED ORDER — KETOROLAC TROMETHAMINE 30 MG/ML IJ SOLN
INTRAMUSCULAR | Status: AC
  Filled 2012-09-11: qty 1

## 2012-09-11 MED ORDER — IBUPROFEN 800 MG PO TABS: 800.0000 mg | ORAL_TABLET | Freq: Three times a day (TID) | ORAL | Status: DC | PRN

## 2012-09-11 MED ORDER — PROPOFOL 10 MG/ML IV BOLUS
INTRAVENOUS | Status: DC | PRN
  Administered 2012-09-11: 180 mg via INTRAVENOUS

## 2012-09-11 MED ORDER — LIDOCAINE HCL (CARDIAC) 20 MG/ML IV SOLN
INTRAVENOUS | Status: AC
  Filled 2012-09-11: qty 5

## 2012-09-11 MED ORDER — LACTATED RINGERS IV SOLN
INTRAVENOUS | Status: DC
  Administered 2012-09-11 (×2): via INTRAVENOUS

## 2012-09-11 MED ORDER — MIDAZOLAM HCL 2 MG/2ML IJ SOLN
INTRAMUSCULAR | Status: AC
  Filled 2012-09-11: qty 2

## 2012-09-11 MED ORDER — MIDAZOLAM HCL 5 MG/5ML IJ SOLN
INTRAMUSCULAR | Status: DC | PRN
  Administered 2012-09-11: 2 mg via INTRAVENOUS

## 2012-09-11 MED ORDER — METOCLOPRAMIDE HCL 5 MG/ML IJ SOLN: 10.0000 mg | Freq: Once | INTRAMUSCULAR | Status: DC | PRN

## 2012-09-11 MED ORDER — MEPERIDINE HCL 25 MG/ML IJ SOLN: 6.2500 mg | INTRAMUSCULAR | Status: DC | PRN

## 2012-09-11 MED ORDER — ONDANSETRON HCL 4 MG/2ML IJ SOLN
INTRAMUSCULAR | Status: AC
  Filled 2012-09-11: qty 2

## 2012-09-11 MED ORDER — PROPOFOL 10 MG/ML IV EMUL
INTRAVENOUS | Status: AC
  Filled 2012-09-11: qty 20

## 2012-09-11 MED ORDER — LIDOCAINE HCL 1 % IJ SOLN
INTRAMUSCULAR | Status: DC | PRN
  Administered 2012-09-11: 10 mL

## 2012-09-11 MED ORDER — DEXAMETHASONE SODIUM PHOSPHATE 10 MG/ML IJ SOLN
INTRAMUSCULAR | Status: AC
  Filled 2012-09-11: qty 1

## 2012-09-11 MED ORDER — LIDOCAINE HCL (CARDIAC) 20 MG/ML IV SOLN
INTRAVENOUS | Status: DC | PRN
  Administered 2012-09-11: 50 mg via INTRAVENOUS

## 2012-09-11 SURGICAL SUPPLY — 15 items
CANISTER SUCTION 2500CC (MISCELLANEOUS) ×2 IMPLANT
CATH ROBINSON RED A/P 16FR (CATHETERS) ×2 IMPLANT
CLOTH BEACON ORANGE TIMEOUT ST (SAFETY) ×2 IMPLANT
CONTAINER PREFILL 10% NBF 60ML (FORM) ×4 IMPLANT
DRESSING TELFA 8X3 (GAUZE/BANDAGES/DRESSINGS) ×2 IMPLANT
ELECT REM PT RETURN 9FT ADLT (ELECTROSURGICAL) ×2
ELECTRODE REM PT RTRN 9FT ADLT (ELECTROSURGICAL) ×1 IMPLANT
GLOVE BIO SURGEON STRL SZ 6.5 (GLOVE) ×2 IMPLANT
GOWN STRL REIN XL XLG (GOWN DISPOSABLE) ×4 IMPLANT
LOOP ANGLED CUTTING 22FR (CUTTING LOOP) IMPLANT
NEEDLE SPNL 20GX3.5 QUINCKE YW (NEEDLE) ×2 IMPLANT
PACK HYSTEROSCOPY LF (CUSTOM PROCEDURE TRAY) ×2 IMPLANT
PAD OB MATERNITY 4.3X12.25 (PERSONAL CARE ITEMS) ×2 IMPLANT
TOWEL OR 17X24 6PK STRL BLUE (TOWEL DISPOSABLE) ×4 IMPLANT
WATER STERILE IRR 1000ML POUR (IV SOLUTION) ×2 IMPLANT

## 2012-09-11 NOTE — Transfer of Care (Signed)
Immediate Anesthesia Transfer of Care Note  Patient: Natasha Simmons  Procedure(s) Performed: Procedure(s): DILATATION & CURETTAGE/HYSTEROSCOPY WITH RESECTOCOPE (N/A)  Patient Location: PACU  Anesthesia Type:General  Level of Consciousness: awake, alert , oriented and patient cooperative  Airway & Oxygen Therapy: Patient Spontanous Breathing and Patient connected to nasal cannula oxygen  Post-op Assessment: Report given to PACU RN, Post -op Vital signs reviewed and stable and Patient moving all extremities X 4  Post vital signs: Reviewed and stable  Complications: No apparent anesthesia complications

## 2012-09-11 NOTE — Op Note (Signed)
Natasha Simmons, DINH NO.:  [id number]  MEDICAL RECORD NO.:  [id number]  LOCATION:  [facility]                          FACILITY:  [facility]  PHYSICIAN:  Randye Lobo, M.D.   DATE OF BIRTH:  April 27, 1955  DATE OF PROCEDURE:  09/11/2012 DATE OF DISCHARGE:                              OPERATIVE REPORT   PREOPERATIVE DIAGNOSES: 1. Postmenopausal bleeding. 2. Endometrial mass.  POSTOPERATIVE DIAGNOSES: 1. Postmenopausal bleeding. 2. Endometrial mass.  PROCEDURE:  Hysteroscopic polypectomy with dilation and curettage.  SURGEON:  Randye Lobo, MD  ANESTHESIA:  LMA, paracervical block with 10 mL of 1% lidocaine.  IV FLUIDS:  1000 mL Ringer's lactate.  ESTIMATED BLOOD LOSS:  Minimal.  URINE OUTPUT:  50 mL by I and O catheterization.  1.5% glycine deficit: 105 mL.  COMPLICATIONS:  None.  INDICATIONS FOR THE PROCEDURE:  The patient is a 57 year old gravida 29 Caucasian female who presented with abnormal uterine bleeding while taking low-dose oral contraceptive pills.  During this time, the patient was confirmed via menopause and her original gynecologic provider converted her over to hormone therapy.  Her abnormal bleeding continued. She underwent a sonohysterogram in the office which documented 2 small intramural fibroids and an endometrial mass measuring 1.8 cm which was thought to be consistent with an endometrial polyp.  Discussion was held with the patient regarding her diagnosis and treatment options and a plan was made to proceed with a hysteroscopic polypectomy with dilation and curettage after risks, benefits, and alternatives were reviewed.  FINDINGS:  Examination under anesthesia revealed a small anteverted, anteflexed, mobile uterus.  No adnexal masses were appreciated.  Hysteroscopy demonstrated a 2-cm endometrial polypoid mass which was adherent to the right uterine sidewall.  Both of the tubal ostial regions were unremarkable.  The endometrium  otherwise had a normal appearance.  SPECIMENS:  The endometrial polypoid mass and endometrial curettings were sent to Pathology separately.  DESCRIPTION OF PROCEDURE:  The patient was reidentified in the preoperative hold area.  She received PAS stockings for DVT prophylaxis.  In the operating room, the patient was placed on the operating room table and then placed in the stirrups to ensure comfort of her knees during the procedure.  She then received the LMA anesthetic.  The patient was then sterilely prepped and draped.  An exam under anesthesia was performed.  The bladder was catheterized of urine.  A weighted speculum was placed inside the vagina.  A single-tooth tenaculum was placed on the anterior cervical lip.  A paracervical block was then performed with a total of 10 mL of 1% lidocaine.  The cervix initially was noted to be stenotic, but was easily dilated with Shawnie Pons dilators. The uterus was sounded to 8 cm.  The cervix was dilated to a # 21 Pratt dilator and the diagnostic hysteroscope was then inserted under the continuous infusion of glycine.  The findings are as noted above.  The cervix was further dilated to a #29 Pratt dilator.  An initial attempt was made to use a polyp forceps for removal of a polyp, but this was not possible.  The resectoscope was therefore introduced under the continuous infusion of glycine and the  base of the polypoid mass was resected with the exception of 1 small area.  This was done using a cutting setting of the cautery.  The resectoscope was then removed and the polypoid mass was grasped with a polyp forceps and was removed in entirety.  The resectoscope was placed again.  This confirmed the absence of the mass.  The base of the surgical site was then cauterized with monopolar cautery as there were a few small bleeding vessels noted when pressure was decreased inside the uterine cavity.  This provided good hemostasis.  The resectoscope was  removed and the remaining walls of the endometrium were curetted with a sharp curette.  This produced minimal endometrial curettings which were sent to Pathology.  The cervix bled very minimally from the tenaculum that had been placed on the anterior cervical lip.  This responded to pressure with a ring forceps.  Hemostasis was excellent at this time and all vaginal instruments were removed.  The patient was taken out of the dorsal lithotomy position, awakened, and extubated.  She was escorted to the recovery room in stable condition.  There were no complications to the procedure.  Needle, instrument, and sponge counts were correct.     Randye Lobo, M.D.     BES/MEDQ  D:  09/11/2012  T:  09/11/2012  Job:  [id number]

## 2012-09-11 NOTE — Brief Op Note (Signed)
09/11/2012  8:39 AM  PATIENT:  Natasha Simmons  57 y.o. female  PRE-OPERATIVE DIAGNOSIS:  endometrial polyp  POST-OPERATIVE DIAGNOSIS:  endometrial polyp  PROCEDURE:  Procedure(s): DILATATION & CURETTAGE/HYSTEROSCOPY WITH RESECTOCOPE (N/A)  SURGEON:  Surgeon(s) and Role:    * Melony Overly, MD - Primary  PHYSICIAN ASSISTANT:   ASSISTANTS: none   ANESTHESIA:   LMA, paracervical block 1% lidocaine  EBL:  Total I/O In: 1000 [I.V.:1000] Out: 50 [Urine:50]  BLOOD ADMINISTERED:none  DRAINS: none   LOCAL MEDICATIONS USED:  LIDOCAINE 1%, 10 cc  SPECIMEN:  Source of Specimen:  endometrial polyp, endometrial curettings  DISPOSITION OF SPECIMEN:  PATHOLOGY  COUNTS:  YES  TOURNIQUET:  * No tourniquets in log *  DICTATION: .Other Dictation: Dictation Number    PLAN OF CARE: Discharge to home after PACU  PATIENT DISPOSITION:  PACU - hemodynamically stable.   Delay start of Pharmacological VTE agent (>24hrs) due to surgical blood loss or risk of bleeding: not applicable

## 2012-09-11 NOTE — Progress Notes (Signed)
Pre - op Update to History and Physical  Patient examined.  No marked change in status since office pre-op visit.  OK to proceed.

## 2012-09-11 NOTE — Anesthesia Postprocedure Evaluation (Signed)
  Anesthesia Post-op Note  Patient: Natasha Simmons  Procedure(s) Performed: Procedure(s): DILATATION & CURETTAGE/HYSTEROSCOPY WITH RESECTOCOPE (N/A)  Patient Location: PACU  Anesthesia Type:General  Level of Consciousness: awake, alert  and oriented  Airway and Oxygen Therapy: Patient Spontanous Breathing  Post-op Pain: none  Post-op Assessment: Post-op Vital signs reviewed, Patient's Cardiovascular Status Stable, Respiratory Function Stable, Patent Airway, No signs of Nausea or vomiting and Pain level controlled  Post-op Vital Signs: Reviewed and stable  Complications: No apparent anesthesia complications

## 2012-09-11 NOTE — Addendum Note (Signed)
Addendum created 09/11/12 0919 by Tyrone Apple. Malen Gauze, MD   Modules edited: Anesthesia Responsible Staff

## 2012-09-11 NOTE — Anesthesia Preprocedure Evaluation (Signed)
Anesthesia Evaluation  Patient identified by MRN, date of birth, ID band Patient awake    Reviewed: Allergy & Precautions, H&P , NPO status , Patient's Chart, lab work & pertinent test results  Airway Mallampati: II TM Distance: >3 FB Neck ROM: Full    Dental no notable dental hx. (+) Teeth Intact   Pulmonary neg pulmonary ROS,  breath sounds clear to auscultation  Pulmonary exam normal       Cardiovascular negative cardio ROS  Rhythm:Regular Rate:Normal     Neuro/Psych negative neurological ROS  negative psych ROS   GI/Hepatic negative GI ROS, Neg liver ROS,   Endo/Other  negative endocrine ROS  Renal/GU negative Renal ROS  negative genitourinary   Musculoskeletal negative musculoskeletal ROS (+)   Abdominal   Peds  Hematology negative hematology ROS (+)   Anesthesia Other Findings   Reproductive/Obstetrics Endometrial Polyp                           Anesthesia Physical Anesthesia Plan  ASA: I  Anesthesia Plan: General   Post-op Pain Management:    Induction: Intravenous  Airway Management Planned: LMA  Additional Equipment:   Intra-op Plan:   Post-operative Plan: Extubation in OR  Informed Consent: I have reviewed the patients History and Physical, chart, labs and discussed the procedure including the risks, benefits and alternatives for the proposed anesthesia with the patient or authorized representative who has indicated his/her understanding and acceptance.   Dental advisory given  Plan Discussed with: Anesthesiologist, CRNA and Surgeon  Anesthesia Plan Comments:         Anesthesia Quick Evaluation

## 2012-09-12 ENCOUNTER — Encounter (HOSPITAL_COMMUNITY): Payer: Self-pay | Admitting: Obstetrics and Gynecology

## 2012-09-16 ENCOUNTER — Encounter: Payer: Self-pay | Admitting: Obstetrics and Gynecology

## 2012-09-17 ENCOUNTER — Encounter: Payer: Self-pay | Admitting: Obstetrics and Gynecology

## 2012-09-20 ENCOUNTER — Other Ambulatory Visit: Payer: Self-pay | Admitting: Obstetrics and Gynecology

## 2012-09-20 NOTE — Telephone Encounter (Signed)
Pt has appt with you on 09/26/12. You saw her previously for surgery but I didn't see a note where you told her it was okay to start back on HRT. Please approve or deny rx

## 2012-09-20 NOTE — Telephone Encounter (Signed)
Per Oleg[facility] Messier at [facility], pt has RX on hold that we sent in on 08/05/12.  She will fill.  Pt notified.

## 2012-09-20 NOTE — Telephone Encounter (Signed)
[facility] [place] [facility] pills please. Patient needs before end of day as she will run out on Sunday.

## 2012-09-20 NOTE — Telephone Encounter (Signed)
I will approve the Prometrium.  Patient and I will discuss whether or not she will continue the HRT when she comes in for her post op visit.

## 2012-09-25 ENCOUNTER — Ambulatory Visit: Payer: BC Managed Care – PPO | Admitting: Obstetrics and Gynecology

## 2012-09-26 ENCOUNTER — Encounter: Payer: Self-pay | Admitting: Obstetrics and Gynecology

## 2012-09-26 ENCOUNTER — Ambulatory Visit (INDEPENDENT_AMBULATORY_CARE_PROVIDER_SITE_OTHER): Payer: BC Managed Care – PPO | Admitting: Obstetrics and Gynecology

## 2012-09-26 VITALS — BP 100/60 | HR 80 | Ht 65.0 in | Wt 132.0 lb

## 2012-09-26 DIAGNOSIS — Z1239 Encounter for other screening for malignant neoplasm of breast: Secondary | ICD-10-CM

## 2012-09-26 NOTE — Progress Notes (Signed)
Patient ID: Natasha Simmons, female   DOB: 1955-03-20, 57 y.o.   MRN: [id number] GYNECOLOGY PROBLEM VISIT  PCP:  Antony Haste, MD  Referring provider:   HPI: 57 y.o.   Divorced  Caucasian  female   G0P0000 with Patient's last menstrual period was 08/20/2012.   here for 2 week post op visit.  Bled for the first week post op. Took pain medication the first day only. Back to work and doing well.  Patient is on progesterone and Minivelle patch. Has not had FSH checked in past.  Transitioned from OCPs into HRT.   Feels well on it.   GYNECOLOGIC HISTORY: Patient's last menstrual period was 08/20/2012. Sexually active: yes Partner preference: female Contraception:  postmenopausal  Menopausal hormone therapy: no DES exposure:  no Blood transfusions:  no Sexually transmitted diseases:  no  GYN Procedures:  D & C/hysteroscopy Mammogram: 11/2011 wnl              Pap:  08-05-12 wnl History of abnormal pap smear:  no   OB History   Grav Para Term Preterm Abortions TAB SAB Ect Mult Living   0 0 0 0 0 0 0 0 0 0          Family History  Problem Relation Age of Onset  . Thyroid disease Mother   . Hypertension Mother   . Diabetes Father   . Heart disease Father   . Liver disease Father   . Lymphoma Father     There are no active problems to display for this patient.   No past medical history on file.  Past Surgical History  Procedure Laterality Date  . Knee surgery  1976; [phone]    6 knee surgeries total - all right knee  . Wisdom tooth extraction    . Dilatation & currettage/hysteroscopy with resectocope N/A 09/11/2012    Procedure: DILATATION & CURETTAGE/HYSTEROSCOPY WITH RESECTOCOPE;  Surgeon: Melony Overly, MD;  Location: [facility];  Service: Gynecology;  Laterality: N/A;    ALLERGIES: Shrimp  Current Outpatient Prescriptions  Medication Sig Dispense Refill  . cholecalciferol (VITAMIN D) 1000 UNITS tablet Take 1,000 Units by mouth daily.      Marland Kitchen estradiol  (MINIVELLE) 0.1 MG/24HR Place 1 patch (0.1 mg total) onto the skin 2 (two) times a week.  8 patch  11  . Multiple Vitamins-Minerals (MULTIVITAMIN PO) Take by mouth daily.       . Omega-3 Fatty Acids (FISH OIL PO) Take by mouth.      . progesterone (PROMETRIUM) 100 MG capsule TAKE 1 CAPSULE BY MOUTH DAILY AT BEDTIME  30 capsule  0  . ibuprofen (ADVIL,MOTRIN) 800 MG tablet Take 1 tablet (800 mg total) by mouth every 8 (eight) hours as needed for pain.  30 tablet  0   No current facility-administered medications for this visit.     ROS:  Pertinent items are noted in HPI.  SOCIAL HISTORY:  Physical Automotive engineer.   PHYSICAL EXAMINATION:    BP 100/60  Pulse 80  Ht 5\' 5"  (1.651 m)  Wt 132 lb (59.875 kg)  BMI 21.97 kg/m2  LMP 08/20/2012   Wt Readings from Last 3 Encounters:  09/26/12 132 lb (59.875 kg)  09/06/12 133 lb (60.328 kg)  08/20/12 131 lb 3.2 oz (59.512 kg)     Ht Readings from Last 3 Encounters:  09/26/12 5\' 5"  (1.651 m)  09/06/12 5\' 5"  (1.651 m)  08/05/12 5\' 5"  (1.651 m)    General appearance: alert,  cooperative and appears stated age   Pelvic: External genitalia:  no lesions              Urethra:  normal appearing urethra with no masses, tenderness or lesions              Bartholins and Skenes: normal                 Vagina: normal appearing vagina with normal color and discharge, no lesions              Cervix: normal appearance                   Bimanual Exam:  Uterus:  uterus is normal size, shape, consistency and nontender                                      Adnexa: normal adnexa in size, nontender and no   Pathology - benign leiomyoma, benign proliferative endometrium                                       ASSESSMENT  Doing well post op. Good candidate to continue HRT.  PLAN  Counseled on use and side effects of HRT.  Discussed potential risks of MI, DVT, PE, stroke, breast cancer. Will continue with Vivelle and Prometrium  Mammogram at [facility] in November 2014. Return  For annual exam in July 2015.   An After Visit Summary was printed and given to the patient.

## 2012-09-27 ENCOUNTER — Encounter: Payer: Self-pay | Admitting: Obstetrics and Gynecology

## 2012-11-21 ENCOUNTER — Other Ambulatory Visit: Payer: Self-pay

## 2012-12-27 ENCOUNTER — Telehealth: Payer: Self-pay | Admitting: Obstetrics and Gynecology

## 2012-12-27 NOTE — Telephone Encounter (Signed)
I recommend starting with an office visit.  Please greet the patient from me.

## 2012-12-27 NOTE — Telephone Encounter (Signed)
Message left to return call to Natasha Simmons at [phone].    

## 2012-12-27 NOTE — Telephone Encounter (Signed)
Patient has some questions about her HRT.

## 2012-12-27 NOTE — Telephone Encounter (Signed)
OV scheduled. Wished happy holidays and thank you to Dr. Edward Jolly.  Routing to provider for final review. Patient agreeable to disposition. Will close encounter

## 2012-12-27 NOTE — Telephone Encounter (Signed)
Patient returned call. States that she has been having irregular periods and last month "was particularly bad". Having what patient describes as "brain fog and I am just not me". LMP started 12/2 and lasted for 7 days with breast soreness, brain fog and headache." Patient is wondering if she needs any blood hormone testing or change of therapy. States she just didn't know where to start.  Offered office visit with Dr. Edward Jolly and patient is agreeable but wondering if she needs any testing or change of therapy prior to appointment. Advised I would send message to Dr. Edward Jolly and return her call with instructions.

## 2013-01-13 ENCOUNTER — Encounter: Payer: Self-pay | Admitting: Obstetrics and Gynecology

## 2013-01-13 ENCOUNTER — Ambulatory Visit (INDEPENDENT_AMBULATORY_CARE_PROVIDER_SITE_OTHER): Payer: BC Managed Care – PPO | Admitting: Obstetrics and Gynecology

## 2013-01-13 VITALS — BP 100/64 | HR 80 | Resp 20 | Ht 65.0 in | Wt 135.0 lb

## 2013-01-13 DIAGNOSIS — N926 Irregular menstruation, unspecified: Secondary | ICD-10-CM

## 2013-01-13 DIAGNOSIS — N951 Menopausal and female climacteric states: Secondary | ICD-10-CM

## 2013-01-13 DIAGNOSIS — N644 Mastodynia: Secondary | ICD-10-CM

## 2013-01-13 NOTE — Progress Notes (Signed)
Patient ID: Natasha Simmons, female   DOB: [birth year], 57 y.o.   MRN: [id number]  GYNECOLOGY PROBLEM VISIT  PCP:   Referring provider:   HPI: 57 y.o.   Divorced  Caucasian  female   G0P0000 with Patient's last menstrual period was 12/17/2012.   here for vaginal bleeding.  Status post hysteroscopic resection of small uterine fibroid 09/11/12.  (Has 2 other fibroids that are small.)  On HRT, minivelle 0.1 mg twice weekly and prometrium 100 mg daily. Controls hot flashes. No more heavy bleeding but is having bleeding like a period. Bleeds a couple of times a month. LMP 12/2 - 12/9 - changed pad several times a day.  Breast tenderness during this time.  Bleed 12/22 - 12/25 again.   What bothers the patient the most is breast tenderness.  Also having frontal headaches.  Saw opthalmologist and told she needs to change her reading glasses.  Having stress and foggy brain.   Would like to be off HRT but is afraid to go off hormones.   Has an appointment for mammogram at [facility] tomorrow.  GYNECOLOGIC HISTORY: Patient's last menstrual period was 12/17/2012.     OB History   Grav Para Term Preterm Abortions TAB SAB Ect Mult Living   0 0 0 0 0 0 0 0 0 0          Family History  Problem Relation Age of Onset  . Thyroid disease Mother   . Hypertension Mother   . Diabetes Father   . Heart disease Father   . Liver disease Father   . Lymphoma Father     There are no active problems to display for this patient.   Past Medical History  Diagnosis Date  . Fibroid     Past Surgical History  Procedure Laterality Date  . Knee surgery  1976; [phone]    6 knee surgeries total - all right knee  . Wisdom tooth extraction    . Dilatation & currettage/hysteroscopy with resectocope N/A 09/11/2012    Procedure: DILATATION & CURETTAGE/HYSTEROSCOPY WITH RESECTOCOPE;  Surgeon: Melony Overly, MD;  Location: [facility];  Service: Gynecology;  Laterality: N/A;    ALLERGIES:  Shrimp  Current Outpatient Prescriptions  Medication Sig Dispense Refill  . cholecalciferol (VITAMIN D) 1000 UNITS tablet Take 1,000 Units by mouth daily.      Marland Kitchen estradiol (MINIVELLE) 0.1 MG/24HR Place 1 patch (0.1 mg total) onto the skin 2 (two) times a week.  8 patch  11  . Multiple Vitamins-Minerals (MULTIVITAMIN PO) Take by mouth daily.       . Omega-3 Fatty Acids (FISH OIL PO) Take by mouth daily.       . progesterone (PROMETRIUM) 100 MG capsule TAKE 1 CAPSULE BY MOUTH DAILY AT BEDTIME  30 capsule  0   No current facility-administered medications for this visit.     ROS:  Pertinent items are noted in HPI.  SOCIAL HISTORY:  [employer], broadcasting/film/video.  New semester starting next week.  PHYSICAL EXAMINATION:    BP 100/64  Pulse 80  Resp 20  Ht 5\' 5"  (1.651 m)  Wt 135 lb (61.236 kg)  BMI 22.47 kg/m2  LMP 12/17/2012   Wt Readings from Last 3 Encounters:  01/13/13 135 lb (61.236 kg)  09/26/12 132 lb (59.875 kg)  09/06/12 133 lb (60.328 kg)     Ht Readings from Last 3 Encounters:  01/13/13 5\' 5"  (1.651 m)  09/26/12 5\' 5"  (1.651 m)  09/06/12 5\' 5"  (1.651 m)  General appearance: alert, cooperative and appears stated age Abdomen: soft, non-tender; no masses,  no organomegaly Neurologic: Grossly normal  Pelvic: External genitalia:  no lesions              Urethra:  normal appearing urethra with no masses, tenderness or lesions              Bartholins and Skenes: normal                 Vagina: normal appearing vagina with normal color and discharge, no lesions              Cervix: normal appearance                  Bimanual Exam:  Uterus:  uterus is normal size, shape, consistency and nontender.  Retroverted.                                      Adnexa: normal adnexa in size, nontender and no masses                                        ASSESSMENT  Menopausal symptoms. Mastalgia. Irregular menses. History of uterine fibroids. Status post hysteroscopic myomectomy.   PLAN  Stop  Minivelle and Prometrium today. Return in 2 weeks for [facility] and estradiol. Return  In 2 1/2 weeks for talking visit to determine plan.  25 minutes face to face time of which over 50% was spent in counseling.    An After Visit Summary was printed and given to the patient.

## 2013-01-13 NOTE — Patient Instructions (Signed)
Stop all of your hormone therapy today. Come in in 2 weeks for blood work.

## 2013-01-14 ENCOUNTER — Ambulatory Visit (HOSPITAL_COMMUNITY)
Admission: RE | Admit: 2013-01-14 | Discharge: 2013-01-14 | Disposition: A | Discharge status: Home / Self Care | Payer: BC Managed Care – PPO | Source: Ambulatory Visit | Attending: Obstetrics and Gynecology | Admitting: Obstetrics and Gynecology

## 2013-01-14 DIAGNOSIS — Z1231 Encounter for screening mammogram for malignant neoplasm of breast: Secondary | ICD-10-CM | POA: Insufficient documentation

## 2013-01-14 DIAGNOSIS — Z1239 Encounter for other screening for malignant neoplasm of breast: Secondary | ICD-10-CM

## 2013-01-27 ENCOUNTER — Other Ambulatory Visit (INDEPENDENT_AMBULATORY_CARE_PROVIDER_SITE_OTHER): Payer: BC Managed Care – PPO

## 2013-01-27 DIAGNOSIS — N951 Menopausal and female climacteric states: Secondary | ICD-10-CM

## 2013-01-28 LAB — ESTRADIOL

## 2013-01-28 LAB — FOLLICLE STIMULATING HORMONE: FSH: 112.4 m[IU]/mL

## 2013-01-29 ENCOUNTER — Encounter: Payer: Self-pay | Admitting: Obstetrics and Gynecology

## 2013-02-03 ENCOUNTER — Ambulatory Visit (INDEPENDENT_AMBULATORY_CARE_PROVIDER_SITE_OTHER): Payer: BC Managed Care – PPO | Admitting: Obstetrics and Gynecology

## 2013-02-03 ENCOUNTER — Encounter: Payer: Self-pay | Admitting: Obstetrics and Gynecology

## 2013-02-03 VITALS — BP 120/57 | HR 71 | Resp 18 | Ht 65.0 in | Wt 132.4 lb

## 2013-02-03 DIAGNOSIS — N951 Menopausal and female climacteric states: Secondary | ICD-10-CM

## 2013-02-03 MED ORDER — PROGESTERONE MICRONIZED 100 MG PO CAPS: 100.0000 mg | ORAL_CAPSULE | Freq: Every day | ORAL | Status: DC

## 2013-02-03 MED ORDER — ESTRADIOL 0.05 MG/24HR TD PTTW: 1.0000 | MEDICATED_PATCH | TRANSDERMAL | Status: DC

## 2013-02-03 NOTE — Progress Notes (Signed)
Patient ID: Natasha Simmons, female   DOB: 1955/10/20, 58 y.o.   MRN: [id number]  Subjective  Patient stopped HRT on 01/13/13. Having strong hot flashes during the day and night.  Miserable. Notes brain fog. Wants immediate relief.  No headaches since stopping HRT.  FSH 112.4 and Estradiol < 11.8 on 01/27/13.  Had bleeding 12/17/12 - 12/24/12 like a menses. Again 01/06/13 - 12/30/12 - light spotting.  Stopped HRT on 01/13/13.   Bled 01/16/13 - normal flow. Bled 01/17/13 and 1/315 - spotting.   Status post hysteroscopic resection of fibroid on 09/11/12.  Objective  No exam.  Assessment  Menopausal symptoms off HRT.  Plan  I had a comprehensive discussion with the patient regarding treatment of menopausal symptoms - risks and beneftis - with OTC options, HRT, and SSRIs, i.e. Brisdelle.  I discussed the [facility] and risks of HRT - breast cancer, MI PE, DVT.   Patient opts for return to HRT in lower dosage.   Will start Minivelle 0.05 mg twice weekly and Prometrium 100 mg nightly. See Epic orders. Written literature about menopause to patient.  Follow up in 2 months and prn.  25 minutes face to face time of which over 50% was spent in counseling.

## 2013-02-03 NOTE — Patient Instructions (Signed)
Please keep a bleeding calendar for the next several months.   Call if you need anything before your recheck appointment.

## 2013-02-04 ENCOUNTER — Encounter: Payer: Self-pay | Admitting: Obstetrics and Gynecology

## 2013-04-04 ENCOUNTER — Encounter: Payer: Self-pay | Admitting: Obstetrics and Gynecology

## 2013-04-04 ENCOUNTER — Ambulatory Visit (INDEPENDENT_AMBULATORY_CARE_PROVIDER_SITE_OTHER): Payer: BC Managed Care – PPO | Admitting: Obstetrics and Gynecology

## 2013-04-04 VITALS — BP 110/66 | HR 70 | Ht 65.0 in | Wt 134.0 lb

## 2013-04-04 DIAGNOSIS — N951 Menopausal and female climacteric states: Secondary | ICD-10-CM

## 2013-04-04 NOTE — Patient Instructions (Signed)
Call if you need anything.  I will see you in July for your annual examination!

## 2013-04-04 NOTE — Progress Notes (Signed)
Patient ID: Natasha Simmons, female   DOB: 24-Apr-1955, 58 y.o.   MRN: [id number]  GYNECOLOGY  VISIT   HPI: 58 y.o.   Divorced  Caucasian  female   [place] with Patient's last menstrual period was 01/16/2013.   here for HRT recheck. Restarted Minivelle 0.05 mg twice weekly and Prometrium 100 mg nightly in January 2015. Feels like breast tissue has shrunk.  No sensitivity. No hot flashes. Sleeping well.  No bleeding since January.  Brain fog has improved.  No headaches.  "Feels great." Energy goo and exercising.   FSH 112.4 and Estradiol < 11.8 on 01/27/13.   Had bleeding 12/17/12 - 12/24/12 like a menses.  Again 01/06/13 - 12/30/12 - light spotting.  Stopped HRT on 01/13/13.  Bled 01/16/13 - normal flow.  Bled 01/17/13 and 01/18/13 - spotting.  Status post hysteroscopic resection of fibroid on 09/11/12.      GYNECOLOGIC HISTORY: Patient's last menstrual period was 01/16/2013. Contraception:    Menopausal hormone therapy:         OB History   Grav Para Term Preterm Abortions TAB SAB Ect Mult Living   0 0 0 0 0 0 0 0 0 0          There are no active problems to display for this patient.   Past Medical History  Diagnosis Date  . Fibroid     Past Surgical History  Procedure Laterality Date  . Knee surgery  1976; [phone]    6 knee surgeries total - all right knee  . Wisdom tooth extraction    . Dilatation & currettage/hysteroscopy with resectocope N/A 09/11/2012    Procedure: DILATATION & CURETTAGE/HYSTEROSCOPY WITH RESECTOCOPE;  Surgeon: Arloa Koh, MD;  Location: [facility];  Service: Gynecology;  Laterality: N/A;    Current Outpatient Prescriptions  Medication Sig Dispense Refill  . cholecalciferol (VITAMIN D) 1000 UNITS tablet Take 1,000 Units by mouth daily.      Marland Kitchen estradiol (MINIVELLE) 0.05 MG/24HR patch Place 1 patch (0.05 mg total) onto the skin 2 (two) times a week. Apply anywhere on lower abdomen.  Change patch twice a week.  8 patch  5  . Multiple  Vitamins-Minerals (MULTIVITAMIN PO) Take by mouth daily.       . Omega-3 Fatty Acids (FISH OIL PO) Take by mouth daily.       . progesterone (PROMETRIUM) 100 MG capsule Take 1 capsule (100 mg total) by mouth daily.  30 capsule  5   No current facility-administered medications for this visit.     ALLERGIES: Shrimp  Family History  Problem Relation Age of Onset  . Thyroid disease Mother   . Hypertension Mother   . Diabetes Father   . Heart disease Father   . Liver disease Father   . Lymphoma Father     History   Social History  . Marital Status: Divorced    Spouse Name: N/A    Number of Children: N/A  . Years of Education: N/A   Occupational History  . Not on file.   Social History Main Topics  . Smoking status: Never Smoker   . Smokeless tobacco: Never Used  . Alcohol Use: 0.5 oz/week    1 drink(s) per week     Comment: occ glass of wine  . Drug Use: No  . Sexual Activity: Yes    Partners: Male    Birth Control/ Protection: None   Other Topics Concern  . Not on file   Social History Narrative  .  No narrative on file    ROS:  Pertinent items are noted in HPI.  PHYSICAL EXAMINATION:    BP 110/66  Pulse 70  Ht 5\' 5"  (1.651 m)  Wt 134 lb (60.782 kg)  BMI 22.30 kg/m2  LMP 01/16/2013     General appearance: alert, cooperative and appears stated age   ASSESSMENT  Menopausal symptoms controlled on HRT.   PLAN  Continue current HRT regimen - Minivelle 0.05 mg twice weekly and Prometrium 100 mg daily.  Has refills in EPIC. Wil reassess hormone therapy yearly and prn.  I discussed weaning off HRT when the time comes to help with patient avoid a resurgence of symptoms. Return for annual examination in July.    An After Visit Summary was printed and given to the patient.  _15_____ minutes face to face time of which over 50% was spent in counseling.

## 2013-07-16 ENCOUNTER — Other Ambulatory Visit: Payer: Self-pay | Admitting: Obstetrics and Gynecology

## 2013-07-16 NOTE — Telephone Encounter (Signed)
Last AEX 07/2012 Last refill 02/03/13 #8/5 refills Next appt 08/06/13 MMG 12/2012 [id number]  Rx sent for 1 month to last until next appt.

## 2013-07-31 DIAGNOSIS — N951 Menopausal and female climacteric states: Secondary | ICD-10-CM | POA: Insufficient documentation

## 2013-07-31 DIAGNOSIS — M542 Cervicalgia: Secondary | ICD-10-CM | POA: Insufficient documentation

## 2013-08-06 ENCOUNTER — Ambulatory Visit (INDEPENDENT_AMBULATORY_CARE_PROVIDER_SITE_OTHER): Payer: BC Managed Care – PPO | Admitting: Obstetrics and Gynecology

## 2013-08-06 ENCOUNTER — Encounter: Payer: Self-pay | Admitting: Obstetrics and Gynecology

## 2013-08-06 VITALS — BP 90/58 | HR 80 | Resp 16 | Ht 65.0 in | Wt 131.0 lb

## 2013-08-06 DIAGNOSIS — Z01419 Encounter for gynecological examination (general) (routine) without abnormal findings: Secondary | ICD-10-CM

## 2013-08-06 DIAGNOSIS — Z0000 Encounter for general adult medical examination without abnormal findings: Secondary | ICD-10-CM

## 2013-08-06 DIAGNOSIS — M25569 Pain in unspecified knee: Secondary | ICD-10-CM

## 2013-08-06 DIAGNOSIS — F419 Anxiety disorder, unspecified: Secondary | ICD-10-CM

## 2013-08-06 DIAGNOSIS — F411 Generalized anxiety disorder: Secondary | ICD-10-CM

## 2013-08-06 LAB — POCT URINALYSIS DIPSTICK
Bilirubin, UA: NEGATIVE
GLUCOSE UA: NEGATIVE
Ketones, UA: NEGATIVE
Leukocytes, UA: NEGATIVE
NITRITE UA: NEGATIVE
PH UA: 7
Protein, UA: NEGATIVE
UROBILINOGEN UA: NEGATIVE

## 2013-08-06 LAB — HEMOGLOBIN, FINGERSTICK: Hemoglobin, fingerstick: 13.3 g/dL (ref 12.0–16.0)

## 2013-08-06 LAB — CBC
HEMATOCRIT: 39.4 % (ref 36.0–46.0)
Hemoglobin: 13.2 g/dL (ref 12.0–15.0)
MCH: 31.3 pg (ref 26.0–34.0)
MCHC: 33.5 g/dL (ref 30.0–36.0)
MCV: 93.4 fL (ref 78.0–100.0)
Platelets: 284 10*3/uL (ref 150–400)
RBC: 4.22 MIL/uL (ref 3.87–5.11)
RDW: 12.7 % (ref 11.5–15.5)
WBC: 5.7 10*3/uL (ref 4.0–10.5)

## 2013-08-06 MED ORDER — ESTRADIOL 0.05 MG/24HR TD PTTW: 1.0000 | MEDICATED_PATCH | TRANSDERMAL | Status: DC

## 2013-08-06 MED ORDER — PROGESTERONE MICRONIZED 100 MG PO CAPS: 100.0000 mg | ORAL_CAPSULE | Freq: Every day | ORAL | Status: DC
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
## 2013-08-06 NOTE — Patient Instructions (Signed)

## 2013-08-07 ENCOUNTER — Ambulatory Visit: Payer: BC Managed Care – PPO | Admitting: Gynecology

## 2013-08-07 LAB — LIPID PANEL
CHOL/HDL RATIO: 2.1 ratio
Cholesterol: 149 mg/dL (ref 0–200)
HDL: 70 mg/dL (ref >39)
LDL CALC: 63 mg/dL (ref 0–99)
Triglycerides: 78 mg/dL (ref <150)
VLDL: 16 mg/dL (ref 0–40)

## 2013-08-07 LAB — COMPREHENSIVE METABOLIC PANEL
ALT: 13 U/L (ref 0–35)
AST: 16 U/L (ref 0–37)
Albumin: 4.3 g/dL (ref 3.5–5.2)
Alkaline Phosphatase: 64 U/L (ref 39–117)
BILIRUBIN TOTAL: 0.8 mg/dL (ref 0.2–1.2)
BUN: 20 mg/dL (ref 6–23)
CO2: 28 mEq/L (ref 19–32)
CREATININE: 0.85 mg/dL (ref 0.50–1.10)
Calcium: 9.1 mg/dL (ref 8.4–10.5)
Chloride: 103 mEq/L (ref 96–112)
Glucose, Bld: 81 mg/dL (ref 70–99)
Potassium: 4.2 mEq/L (ref 3.5–5.3)
Sodium: 139 mEq/L (ref 135–145)
Total Protein: 6.7 g/dL (ref 6.0–8.3)

## 2013-08-07 LAB — TSH: TSH: 1.562 u[IU]/mL (ref 0.350–4.500)

## 2013-08-07 LAB — SEDIMENTATION RATE: Sed Rate: 1 mm/hr (ref 0–22)

## 2013-08-07 LAB — ANA: Anti Nuclear Antibody(ANA): NEGATIVE

## 2013-08-08 ENCOUNTER — Encounter: Payer: Self-pay | Admitting: Obstetrics and Gynecology

## 2013-08-25 ENCOUNTER — Other Ambulatory Visit: Payer: Self-pay | Admitting: Obstetrics and Gynecology

## 2013-08-26 NOTE — Telephone Encounter (Signed)
Last AEX: 08/06/13 Last refill:08/06/13 #30 X 11 Current AEX:08/10/14 Last MMG: 01/15/13 BI-RADS Neg  Pt was given new rx on 08/06/13 (AEX) Encounter closed

## 2013-12-09 ENCOUNTER — Telehealth: Payer: Self-pay | Admitting: Obstetrics and Gynecology

## 2013-12-09 NOTE — Telephone Encounter (Signed)
Left message upcoming appointment 07/2014 time has changed.

## 2014-01-02 ENCOUNTER — Other Ambulatory Visit: Payer: Self-pay | Admitting: Obstetrics and Gynecology

## 2014-01-02 DIAGNOSIS — Z1231 Encounter for screening mammogram for malignant neoplasm of breast: Secondary | ICD-10-CM

## 2014-01-19 ENCOUNTER — Ambulatory Visit (HOSPITAL_COMMUNITY)
Admission: RE | Admit: 2014-01-19 | Discharge: 2014-01-19 | Disposition: A | Discharge status: Home / Self Care | Payer: BC Managed Care – PPO | Source: Ambulatory Visit | Attending: Obstetrics and Gynecology | Admitting: Obstetrics and Gynecology

## 2014-01-19 DIAGNOSIS — Z1231 Encounter for screening mammogram for malignant neoplasm of breast: Secondary | ICD-10-CM | POA: Insufficient documentation

## 2014-04-27 ENCOUNTER — Telehealth: Payer: Self-pay | Admitting: Obstetrics and Gynecology

## 2014-04-27 NOTE — Telephone Encounter (Signed)
Patient is asking to see Dr.Silva for "troublesome symptoms". Last seen 08/06/13.

## 2014-04-27 NOTE — Telephone Encounter (Signed)
Spoke with patient. Patient states "I have not had a cycle for 14 months and had to have a fibroid removed a year and a half ago. I started spotting last Thursday. I am wearing a light pad and notice it on there and when I wipe." Denies any abdominal discomfort. Advised will need to be seen with Dr.Silva for further evaluation. Patient is agreeable. 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 Appointment scheduled for 4/14 at 3pm with Dr.Silva. Patient is agreeable to date and time.  Routing to provider for final review. Patient agreeable to disposition. Will close encounter

## 2014-04-27 NOTE — Telephone Encounter (Signed)
Left message to call Tegan Britain at [phone]. 

## 2014-04-29 ENCOUNTER — Telehealth: Payer: Self-pay | Admitting: Obstetrics and Gynecology

## 2014-04-29 NOTE — Telephone Encounter (Signed)
Pt scheduled with Dr Quincy Simmonds tomorrow 04/30/14 @ 3pm. Pt called to check if there are any appointments after 3 tomorrow or after 11am on Friday 05/01/14. Pt would greatly appreciate if we had anything else available.   Pt can be reached at cell phone or if tomorrow morning/friday morning work: [phone] ext [phone]

## 2014-04-29 NOTE — Telephone Encounter (Signed)
Return call to patient, left message (VM confirms number) that no alternative appointment currently available as she requested. May call back in am to see if any changes or cancellations.

## 2014-04-30 ENCOUNTER — Encounter: Payer: Self-pay | Admitting: Obstetrics and Gynecology

## 2014-04-30 ENCOUNTER — Ambulatory Visit (INDEPENDENT_AMBULATORY_CARE_PROVIDER_SITE_OTHER): Payer: BC Managed Care – PPO | Admitting: Obstetrics and Gynecology

## 2014-04-30 VITALS — BP 110/70 | HR 88 | Ht 65.0 in | Wt 125.2 lb

## 2014-04-30 DIAGNOSIS — N950 Postmenopausal bleeding: Secondary | ICD-10-CM | POA: Diagnosis not present

## 2014-04-30 NOTE — Patient Instructions (Signed)
Call for fever, heavy vaginal bleeding, or increasing pain.

## 2014-04-30 NOTE — Progress Notes (Signed)
Patient ID: Natasha Simmons, female   DOB: 09-Aug-1955, 59 y.o.   MRN: [id number] GYNECOLOGY  VISIT   HPI: 59 y.o.   Divorced  Caucasian  female   Paoli with Patient's last menstrual period was 01/16/2013.   here for postmenopausal bleeding.   Bleeding started 04/22/13 and lasted for 4 days.  Wore a tampon to confirm bleeding.  No real cramping.  Had diarrhea last week around the same time.  Had bleeding around the rectum at the same time.  States she eats a very high fiber diet.  No diarrhea or bleeding now.   On Vivelle and Prometrium.  No missed dosages.   History of hysteroscopy with dilation and curettage. 09/11/12. Benign leiomyoma.  1.8 cm on sonohysterogram.  2.0 cm at time of surgery by my estimate.  Some buttock and left posterior superior thigh discomfort.   GYNECOLOGIC HISTORY: Patient's last menstrual period was 01/16/2013. Contraception:  postmenopausal  Menopausal hormone therapy:  Vivelle Dot, Prometrium        OB History    Gravida Para Term Preterm AB TAB SAB Ectopic Multiple Living   0 0 0 0 0 0 0 0 0 0          There are no active problems to display for this patient.   Past Medical History  Diagnosis Date  . Fibroid     Past Surgical History  Procedure Laterality Date  . Knee surgery  1976; [id number]    6 knee surgeries total - all right knee  . Wisdom tooth extraction    . Dilatation & currettage/hysteroscopy with resectocope N/A 09/11/2012    Procedure: DILATATION & CURETTAGE/HYSTEROSCOPY WITH RESECTOCOPE;  Surgeon: Arloa Koh, MD;  Location: [facility];  Service: Gynecology;  Laterality: N/A;    Current Outpatient Prescriptions  Medication Sig Dispense Refill  . cholecalciferol (VITAMIN D) 1000 UNITS tablet Take 1,000 Units by mouth daily.    Marland Kitchen estradiol (VIVELLE-DOT) 0.05 MG/24HR patch Place 1 patch (0.05 mg total) onto the skin 2 (two) times a week. 8 patch 11  . Multiple Vitamin (MULTI VITAMIN DAILY PO) Take by mouth.    . Omega-3 Fatty  Acids (FISH OIL PO) Take by mouth daily.     . progesterone (PROMETRIUM) 100 MG capsule Take 1 capsule (100 mg total) by mouth daily. 30 capsule 11   No current facility-administered medications for this visit.     ALLERGIES: Other and Shrimp  Family History  Problem Relation Age of Onset  . Thyroid disease Mother   . Hypertension Mother   . Diabetes Father   . Heart disease Father   . Liver disease Father   . Lymphoma Father     History   Social History  . Marital Status: Divorced    Spouse Name: N/A  . Number of Children: N/A  . Years of Education: N/A   Occupational History  . Not on file.   Social History Main Topics  . Smoking status: Never Smoker   . Smokeless tobacco: Never Used  . Alcohol Use: 0.6 oz/week    1 Glasses of wine per week     Comment: occ glass of wine  . Drug Use: No  . Sexual Activity:    Partners: Male    Birth Control/ Protection: Post-menopausal   Other Topics Concern  . Not on file   Social History Narrative    ROS:  Pertinent items are noted in HPI.  PHYSICAL EXAMINATION:    BP 110/70 mmHg  Pulse 88  Ht 5\' 5"  (1.651 m)  Wt 125 lb 3.2 oz (56.79 kg)  BMI 20.83 kg/m2  LMP 01/16/2013     General appearance: alert, cooperative and appears stated age  Pelvic: External genitalia:  no lesions              Urethra:  normal appearing urethra with no masses, tenderness or lesions              Bartholins and Skenes: normal                 Vagina: normal appearing vagina with normal color and discharge, no lesions              Cervix: normal appearance                   Bimanual Exam:  Uterus:  uterus is normal size, shape, consistency and nontender                                      Adnexa: normal adnexa in size, nontender and no masses.  Procedure - endometrial biopsy Consent performed. Speculum place in vagina.  Sterile prep of cervix with Hibiclens..  Pipelle placed to   7      cm without difficulty twice. Tissue obtained  and sent to pathology. Speculum removed.  No complications. Minimal EBL.   ASSESSMENT  Postmenopausal bleeding.  Status post resection of submucous fibroid.  HRT patient.  Recent diarrhea with rectal spotting.    PLAN  Discussion of postmenopausal bleeding.  Follow up EMB. Precautions given.  OK to continue with HRT for now.  Return for sonohysterogram.    An After Visit Summary was printed and given to the patient.  ___15___ minutes face to face time of which over 50% was spent in counseling.

## 2014-05-04 LAB — IPS OTHER TISSUE BIOPSY

## 2014-05-04 NOTE — Telephone Encounter (Signed)
Left message for patient to call back. Need to go over benefits and schedule PUS °

## 2014-05-05 ENCOUNTER — Telehealth: Payer: Self-pay

## 2014-05-05 NOTE — Telephone Encounter (Signed)
Left message to call Kenlyn Lose at [phone]. 

## 2014-05-05 NOTE — Telephone Encounter (Signed)
-----   Message from Nunzio Cobbs, MD sent at 05/04/2014  6:14 PM EDT ----- Please let patient know that her endometrial biopsy was negative for abnormal cells, polyps, or infection.  It showed atrophy and mild estrogenic effect.   I will see her for her ultrasound/sonohystereogram appointment!  Cc- Marisa Sprinkles

## 2014-05-06 NOTE — Telephone Encounter (Signed)
Spoke with patient. Advised of results as seen below from [place]. Patient is agreeable and verbalizes understanding. Appointment for [facility] scheduled for 5/19 at 9:30am with 10am consult with Dr.Silva. Patient is agreeable to date and time.  Routing to provider for final review. Patient agreeable to disposition. Will close encounter

## 2014-05-27 ENCOUNTER — Encounter: Payer: Self-pay | Admitting: Gastroenterology

## 2014-06-02 NOTE — Telephone Encounter (Signed)
Patient is scheduled for Sonohysterogram with Dr. Quincy Simmonds on 06/04/14. Will close encounter.

## 2014-06-04 ENCOUNTER — Other Ambulatory Visit: Payer: Self-pay | Admitting: Obstetrics and Gynecology

## 2014-06-04 ENCOUNTER — Ambulatory Visit (INDEPENDENT_AMBULATORY_CARE_PROVIDER_SITE_OTHER): Payer: BC Managed Care – PPO

## 2014-06-04 ENCOUNTER — Encounter: Payer: Self-pay | Admitting: Obstetrics and Gynecology

## 2014-06-04 ENCOUNTER — Ambulatory Visit (INDEPENDENT_AMBULATORY_CARE_PROVIDER_SITE_OTHER): Payer: BC Managed Care – PPO | Admitting: Obstetrics and Gynecology

## 2014-06-04 VITALS — BP 102/70 | HR 60 | Ht 65.0 in | Wt 124.0 lb

## 2014-06-04 DIAGNOSIS — N950 Postmenopausal bleeding: Secondary | ICD-10-CM | POA: Diagnosis not present

## 2014-06-04 DIAGNOSIS — D251 Intramural leiomyoma of uterus: Secondary | ICD-10-CM

## 2014-06-04 MED ORDER — ESTRADIOL 0.0375 MG/24HR TD PTWK: 0.0375 mg | MEDICATED_PATCH | TRANSDERMAL | Status: DC

## 2014-06-04 NOTE — Patient Instructions (Signed)
Please place the new Estradiol patch on when your current patch runs out.  You will place the new patch only once a week.  Continue with the Prometrium 100 mg daily.

## 2014-06-04 NOTE — Progress Notes (Signed)
Subjective  59 y.o. G0P0000 Caucasian female here for pelvic ultrasound for postmenopausal bleeding.   Patient's last menstrual period was 01/16/2013.  here for postmenopausal bleeding.  Bleeding started 04/22/13 and lasted for 4 days.  Wore a tampon to confirm bleeding.  No real cramping.  Had diarrhea last week around the same time. Had bleeding around the rectum at the same time. States she eats a very high fiber diet.  No diarrhea or bleeding now.   On Vivelle and Prometrium.  No missed dosages.   History of hysteroscopy with dilation and curettage. 09/11/12. Benign leiomyoma. 1.8 cm on sonohysterogram. 2.0 cm at time of surgery by my estimate.  Had EMB done on 04/30/14 which showed proliferative effect and no atypia or malignancy.   Bled again for a few days last week.   Objective  Pelvic ultrasound images and report reviewed with patient.  Uterus - 2 fibroids  - 0.83 and 1.44 mm. EMS - 7.63 mm. Ovaries - Normal.  Free fluid - mild free fluid.     Procedure - sonohysterogram Consent performed. Speculum placed in vagina. Sterile prep of cervix with betadine. Cannula placed inside endometrial cavity without difficulty. Speculum removed. Sterile saline injected.    No          filling defect noted. Cannula removed. No complication.  No EBL.  Assessment   Postmenopausal bleeding on HRT.  EMB showing proliferative effect.  Uterine fibroids.   Plan  Discussion of fibroids, HRT, and EMB results.  Will reduce estrogen patch to 0.0375 mg weekly.  #4, RF 2. Continue Prometrium 100 mg daily.  Recheck at annual exam in 2 months.    ___15____ minutes face to face time of which over 50% was spent in counseling.   After visit summary to patient.

## 2014-08-10 ENCOUNTER — Ambulatory Visit (INDEPENDENT_AMBULATORY_CARE_PROVIDER_SITE_OTHER): Payer: BC Managed Care – PPO | Admitting: Obstetrics and Gynecology

## 2014-08-10 ENCOUNTER — Encounter: Payer: Self-pay | Admitting: Obstetrics and Gynecology

## 2014-08-10 ENCOUNTER — Ambulatory Visit: Payer: BC Managed Care – PPO | Admitting: Obstetrics and Gynecology

## 2014-08-10 VITALS — BP 104/70 | HR 70 | Resp 18 | Ht 65.0 in | Wt 124.8 lb

## 2014-08-10 DIAGNOSIS — Z0000 Encounter for general adult medical examination without abnormal findings: Secondary | ICD-10-CM

## 2014-08-10 DIAGNOSIS — Z01419 Encounter for gynecological examination (general) (routine) without abnormal findings: Secondary | ICD-10-CM | POA: Diagnosis not present

## 2014-08-10 LAB — COMPREHENSIVE METABOLIC PANEL
ALK PHOS: 64 U/L (ref 33–130)
ALT: 16 U/L (ref 6–29)
AST: 19 U/L (ref 10–35)
Albumin: 4.2 g/dL (ref 3.6–5.1)
BUN: 14 mg/dL (ref 7–25)
CALCIUM: 9.2 mg/dL (ref 8.6–10.4)
CHLORIDE: 106 mmol/L (ref 98–110)
CO2: 27 mmol/L (ref 20–31)
CREATININE: 0.76 mg/dL (ref 0.50–1.05)
GLUCOSE: 92 mg/dL (ref 65–99)
Potassium: 3.9 mmol/L (ref 3.5–5.3)
Sodium: 141 mmol/L (ref 135–146)
TOTAL PROTEIN: 6.3 g/dL (ref 6.1–8.1)
Total Bilirubin: 0.5 mg/dL (ref 0.2–1.2)

## 2014-08-10 LAB — CBC
HCT: 39.2 % (ref 36.0–46.0)
HEMOGLOBIN: 13 g/dL (ref 12.0–15.0)
MCH: 31.6 pg (ref 26.0–34.0)
MCHC: 33.2 g/dL (ref 30.0–36.0)
MCV: 95.1 fL (ref 78.0–100.0)
MPV: 9.7 fL (ref 8.6–12.4)
PLATELETS: 258 10*3/uL (ref 150–400)
RBC: 4.12 MIL/uL (ref 3.87–5.11)
RDW: 12.5 % (ref 11.5–15.5)
WBC: 5.2 10*3/uL (ref 4.0–10.5)

## 2014-08-10 LAB — POCT URINALYSIS DIPSTICK
BILIRUBIN UA: NEGATIVE
Blood, UA: NEGATIVE
GLUCOSE UA: NEGATIVE
Ketones, UA: NEGATIVE
Leukocytes, UA: NEGATIVE
Nitrite, UA: NEGATIVE
PH UA: 5
PROTEIN UA: NEGATIVE
Urobilinogen, UA: NEGATIVE

## 2014-08-10 LAB — LIPID PANEL
CHOLESTEROL: 148 mg/dL (ref 125–200)
HDL: 63 mg/dL (ref >=46)
LDL Cholesterol: 54 mg/dL (ref <130)
TRIGLYCERIDES: 154 mg/dL — AB (ref <150)
Total CHOL/HDL Ratio: 2.3 Ratio (ref <=5.0)
VLDL: 31 mg/dL — AB (ref <30)

## 2014-08-10 MED ORDER — ESTRADIOL 0.0375 MG/24HR TD PTWK: 0.0375 mg | MEDICATED_PATCH | TRANSDERMAL | Status: DC

## 2014-08-10 MED ORDER — PROGESTERONE MICRONIZED 100 MG PO CAPS: 100.0000 mg | ORAL_CAPSULE | Freq: Every day | ORAL | Status: DC

## 2014-08-10 NOTE — Patient Instructions (Signed)

## 2014-08-10 NOTE — Progress Notes (Signed)
Patient ID: Natasha Simmons, female   DOB: 1955/04/09, 59 y.o.   MRN: [id number] 59 y.o. G0P0000 Divorced Caucasian female here for annual exam.    No vaginal bleeding.  No rectal bleeding.   Some nights not sleeping as well.  No hot flashes or night sweats.  Doing well on decreased HRT.  Notes some coloration change in her right lower extremity skin - darker than the left.  Has varicose veins in both lower extremities.   Going to [place] for vacation.  PCP:  Anastasia Pall, MD  Patient's last menstrual period was 01/16/2013.          Sexually active: Yes.  female  The current method of family planning is post menopausal status.    Exercising: Yes.     Smoker:  no  Health Maintenance: Pap:  08-05-12 neg:neg HR HPV History of abnormal Pap:  no MMG:  01-20-14 Density Cat.D/Neg:[facility] Colonoscopy:  10-10-11normal Dr. Erskine Emery.  Next due 10/2019. BMD:   2001  Result  normal TDaP:  2007 Screening Labs:  Hb today: 13.0, Urine today: Neg   reports that she has never smoked. She has never used smokeless tobacco. She reports that she does not drink alcohol or use illicit drugs.  Past Medical History  Diagnosis Date  . Fibroid     Past Surgical History  Procedure Laterality Date  . Knee surgery  1976; [phone]    6 knee surgeries total - all right knee  . Wisdom tooth extraction    . Dilatation & currettage/hysteroscopy with resectocope N/A 09/11/2012    Procedure: DILATATION & CURETTAGE/HYSTEROSCOPY WITH RESECTOCOPE;  Surgeon: Arloa Koh, MD;  Location: [facility];  Service: Gynecology;  Laterality: N/A;    Current Outpatient Prescriptions  Medication Sig Dispense Refill  . cholecalciferol (VITAMIN D) 1000 UNITS tablet Take 1,000 Units by mouth daily.    Marland Kitchen estradiol (CLIMARA) 0.0375 mg/24hr patch Place 1 patch (0.0375 mg total) onto the skin once a week. 4 patch 2  . Multiple Vitamin (MULTI VITAMIN DAILY PO) Take by mouth.    . Omega-3 Fatty Acids (FISH OIL PO)  Take by mouth daily.     . progesterone (PROMETRIUM) 100 MG capsule Take 1 capsule (100 mg total) by mouth daily. 30 capsule 11   No current facility-administered medications for this visit.    Family History  Problem Relation Age of Onset  . Thyroid disease Mother   . Hypertension Mother   . Diabetes Father   . Heart disease Father   . Liver disease Father   . Lymphoma Father     ROS:  Pertinent items are noted in HPI.  Otherwise, a comprehensive ROS was negative.  Exam:   BP 104/70 mmHg  Pulse 70  Resp 18  Ht 5\' 5"  (1.651 m)  Wt 124 lb 12.8 oz (56.609 kg)  BMI 20.77 kg/m2  LMP 01/16/2013    General appearance: alert, cooperative and appears stated age Head: Normocephalic, without obvious abnormality, atraumatic Neck: no adenopathy, supple, symmetrical, trachea midline and thyroid normal to inspection and palpation Lungs: clear to auscultation bilaterally Breasts: normal appearance, no masses or tenderness, Inspection negative, No nipple retraction or dimpling, No nipple discharge or bleeding, No axillary or supraclavicular adenopathy Heart: regular rate and rhythm Abdomen: soft, non-tender; bowel sounds normal; no masses,  no organomegaly Extremities: extremities normal, atraumatic, no cyanosis or edema Skin: Skin color, texture, turgor normal. No rashes or lesions.  Bilateral LE varicose veins.  Some increased  coloration of skin in the right LE. Lymph nodes: Cervical, supraclavicular, and axillary nodes normal. No abnormal inguinal nodes palpated Neurologic: Grossly normal  Pelvic: External genitalia:  no lesions              Urethra:  normal appearing urethra with no masses, tenderness or lesions              Bartholins and Skenes: normal                 Vagina: normal appearing vagina with normal color and discharge, no lesions              Cervix: no lesions              Pap taken: No. Bimanual Exam:  Uterus:  normal size, contour, position, consistency, mobility,  non-tender              Adnexa: normal adnexa and no mass, fullness, tenderness              Rectovaginal: Yes.  .  Confirms.              Anus:  normal sphincter tone, no lesions  Chaperone was present for exam.  Assessment:   Well woman visit with normal exam. HRT patient.  Varicose veins.   Plan: Yearly mammogram recommended after age 3.  Recommended self breast exam.  Pap and HR HPV as above. Discussed Calcium, Vitamin D, regular exercise program including cardiovascular and weight bearing exercise. Labs performed.  Yes.  .   See orders.  Routine labs. Refills given on medications.  Yes.  .  See orders.  Climara 0.0375 mg weekly and Prometrium 100 mg daily.  Discussed risks of DVT, PE, MI, stroke and breast cancer.  Will follow up with her PCP regarding her varicose veins.  May benefit from seeing a specialist.  I discussed compression stockings with travel to reduce risk of superficial and deep thrombophlebitis.   She understands that the HRT can increase the risk of DVT/PE. Follow up annually and prn.     After visit summary provided.

## 2014-08-11 LAB — VITAMIN D 25 HYDROXY (VIT D DEFICIENCY, FRACTURES): VIT D 25 HYDROXY: 43 ng/mL (ref 30–100)

## 2014-08-15 ENCOUNTER — Other Ambulatory Visit: Payer: Self-pay | Admitting: Obstetrics and Gynecology

## 2014-08-17 LAB — HEMOGLOBIN, FINGERSTICK: Hemoglobin, fingerstick: 13 g/dL (ref 12.0–16.0)

## 2014-08-17 NOTE — Telephone Encounter (Signed)
08/10/14 #30/11 rfs sent to [facility] in [place]/[address]-rx denied.

## 2014-08-25 ENCOUNTER — Other Ambulatory Visit: Payer: Self-pay | Admitting: Obstetrics and Gynecology

## 2014-08-25 NOTE — Telephone Encounter (Signed)
08/10/14 #4 Patch/11 rfs sent to [facility] in [place]- rx denied.

## 2014-09-30 ENCOUNTER — Telehealth: Payer: Self-pay | Admitting: Obstetrics and Gynecology

## 2014-09-30 NOTE — Telephone Encounter (Signed)
Left message regarding upcoming appointment has been canceled and needs to be rescheduled. °

## 2014-11-06 DIAGNOSIS — M76899 Other specified enthesopathies of unspecified lower limb, excluding foot: Secondary | ICD-10-CM | POA: Insufficient documentation

## 2014-11-06 DIAGNOSIS — M7072 Other bursitis of hip, left hip: Secondary | ICD-10-CM | POA: Insufficient documentation

## 2014-11-06 DIAGNOSIS — M5416 Radiculopathy, lumbar region: Secondary | ICD-10-CM | POA: Insufficient documentation

## 2014-12-09 ENCOUNTER — Encounter: Payer: Self-pay | Admitting: Obstetrics and Gynecology

## 2014-12-09 NOTE — Telephone Encounter (Signed)
Responded via mychart

## 2015-01-05 ENCOUNTER — Other Ambulatory Visit: Payer: Self-pay

## 2015-01-05 DIAGNOSIS — Z1231 Encounter for screening mammogram for malignant neoplasm of breast: Secondary | ICD-10-CM

## 2015-01-28 ENCOUNTER — Ambulatory Visit
Admission: RE | Admit: 2015-01-28 | Discharge: 2015-01-28 | Disposition: A | Discharge status: Home / Self Care | Payer: BC Managed Care – PPO | Source: Ambulatory Visit

## 2015-01-28 ENCOUNTER — Ambulatory Visit: Payer: BC Managed Care – PPO

## 2015-01-28 DIAGNOSIS — Z1231 Encounter for screening mammogram for malignant neoplasm of breast: Secondary | ICD-10-CM

## 2015-07-28 ENCOUNTER — Other Ambulatory Visit: Payer: Self-pay | Admitting: Obstetrics and Gynecology

## 2015-07-28 NOTE — Telephone Encounter (Signed)
Medication refill request: Climara Patch  Last AEX:  08-10-14 Next AEX: 09-01-15 Last MMG (if hormonal medication request): 01-29-15 WNL Refill authorized: please advise

## 2015-08-09 ENCOUNTER — Other Ambulatory Visit: Payer: Self-pay | Admitting: Obstetrics and Gynecology

## 2015-08-09 NOTE — Telephone Encounter (Signed)
Medication refill request: Progesterone Last AEX:  08/10/14 BS Next AEX: 09/01/15 BS Last MMG (if hormonal medication request): 01/28/15 BIRADS1  Refill authorized: 08/10/14 #30 11R. Please advise. Thank you.

## 2015-08-26 ENCOUNTER — Other Ambulatory Visit: Payer: Self-pay | Admitting: Obstetrics and Gynecology

## 2015-08-27 NOTE — Telephone Encounter (Signed)
Patient called and rescheduled her AEX to 09/09/15. She requests one refill on her Estradiol patch and also said, "I only want one in case the doctor needs to change it or something we I come in."

## 2015-08-27 NOTE — Telephone Encounter (Signed)
Medication refill request: Estradiol Patch Last AEX:  08/10/14 BS Next AEX: 09/01/15 BS Last MMG (if hormonal medication request): 01/29/15 BIRADS1 Density D Refill authorized: 07/28/15 34 0R. Please advise. Thank you.

## 2015-09-01 ENCOUNTER — Ambulatory Visit: Payer: BC Managed Care – PPO | Admitting: Obstetrics and Gynecology

## 2015-09-08 ENCOUNTER — Ambulatory Visit: Payer: BC Managed Care – PPO | Admitting: Obstetrics and Gynecology

## 2015-09-09 ENCOUNTER — Ambulatory Visit (INDEPENDENT_AMBULATORY_CARE_PROVIDER_SITE_OTHER): Payer: BC Managed Care – PPO | Admitting: Obstetrics and Gynecology

## 2015-09-09 ENCOUNTER — Encounter: Payer: Self-pay | Admitting: Obstetrics and Gynecology

## 2015-09-09 VITALS — BP 102/64 | Resp 18 | Ht 65.75 in | Wt 127.0 lb

## 2015-09-09 DIAGNOSIS — Z79890 Hormone replacement therapy (postmenopausal): Secondary | ICD-10-CM

## 2015-09-09 DIAGNOSIS — Z01419 Encounter for gynecological examination (general) (routine) without abnormal findings: Secondary | ICD-10-CM | POA: Diagnosis not present

## 2015-09-09 LAB — POCT URINALYSIS DIPSTICK
Bilirubin, UA: NEGATIVE
GLUCOSE UA: NEGATIVE
KETONES UA: NEGATIVE
Leukocytes, UA: NEGATIVE
Nitrite, UA: NEGATIVE
Protein, UA: NEGATIVE
UROBILINOGEN UA: NEGATIVE
pH, UA: 5

## 2015-09-09 MED ORDER — PROGESTERONE MICRONIZED 100 MG PO CAPS: 100.0000 mg | ORAL_CAPSULE | Freq: Every day | ORAL | 11 refills | Status: DC

## 2015-09-09 MED ORDER — ESTRADIOL 0.0375 MG/24HR TD PTWK: MEDICATED_PATCH | TRANSDERMAL | 11 refills | Status: DC

## 2015-09-09 NOTE — Progress Notes (Signed)
60 y.o. G0P0000 Divorced Caucasian female here for annual exam.    HRT patient.  Asking if she should continue or not.   Some increase in demands at work.  Some fatigue.  Chronic anxiety due to stresses in life. Sleeping 6 - 7 hours but not as restful as in the past.  Exercises and taking good care of herself but feeling overwhelmed. Thinking about retiring.   PCP:    Anastasia Pall, MD  Patient's last menstrual period was 01/16/2013.           Sexually active: Yes.    The current method of family planning is post menopausal status.    Exercising: Yes.    aerobics, fitness walking, cardio 3-5 days a week, weights Smoker:  no  Health Maintenance: Pap:  08-05-12 Neg:Neg HR HPV History of abnormal Pap:  no MMG:  01-28-15 3D/Density D/Neg/BiRads1:[facility] Colonoscopy:  10-25-09 normal Dr. Herbie Baltimore Kaplan;next due 10/2019 BMD:   2001  Result  normal TDaP:  2007.  Will do with PCP. Gardasil:   N/A HIV: will discuss with PCP.  Hep C: will discuss with PCP Screening Labs:  Hb today: patient declines labs today; will have labs with PCP, Urine today: trace RBCs - patient did not have any symptoms.     reports that she has never smoked. She has never used smokeless tobacco. She reports that she does not drink alcohol or use drugs.  Past Medical History:  Diagnosis Date  . Fibroid     Past Surgical History:  Procedure Laterality Date  . DILATATION & CURRETTAGE/HYSTEROSCOPY WITH RESECTOCOPE N/A 09/11/2012   Procedure: DILATATION & CURETTAGE/HYSTEROSCOPY WITH RESECTOCOPE;  Surgeon: Arloa Koh, MD;  Location: [facility];  Service: Gynecology;  Laterality: N/A;  . knee surgery  1976; 2005,2004,2003   6 knee surgeries total - all right knee  . WISDOM TOOTH EXTRACTION      Current Outpatient Prescriptions  Medication Sig Dispense Refill  . cholecalciferol (VITAMIN D) 1000 UNITS tablet Take 1,000 Units by mouth daily.    Marland Kitchen estradiol (CLIMARA - DOSED IN MG/24 HR) 0.0375 mg/24hr  patch APPLY 1 PATCH ONTO THE SKIN ONCE A WEEK 4 patch 11  . Multiple Vitamin (MULTI VITAMIN DAILY PO) Take by mouth.    . Omega-3 Fatty Acids (FISH OIL PO) Take by mouth daily.     . progesterone (PROMETRIUM) 100 MG capsule Take 1 capsule (100 mg total) by mouth daily. 30 capsule 11   No current facility-administered medications for this visit.     Family History  Problem Relation Age of Onset  . Thyroid disease Mother   . Hypertension Mother   . Diabetes Father   . Heart disease Father   . Liver disease Father   . Lymphoma Father     ROS:  Pertinent items are noted in HPI.  Otherwise, a comprehensive ROS was negative.  Exam:   BP 102/64 (BP Location: Right Arm, Patient Position: Sitting, Cuff Size: Normal)   Resp 18   Ht 5' 5.75" (1.67 m)   Wt 127 lb (57.6 kg)   LMP 01/16/2013   BMI 20.65 kg/m     General appearance: alert, cooperative and appears stated age Head: Normocephalic, without obvious abnormality, atraumatic Neck: no adenopathy, supple, symmetrical, trachea midline and thyroid normal to inspection and palpation Lungs: clear to auscultation bilaterally Breasts: normal appearance, no masses or tenderness, No nipple retraction or dimpling, No nipple discharge or bleeding, No axillary or supraclavicular adenopathy Heart: regular rate  and rhythm Abdomen: soft, non-tender; no masses, no organomegaly Extremities: extremities normal, atraumatic, no cyanosis or edema Skin: Skin color, texture, turgor normal. No rashes or lesions Lymph nodes: Cervical, supraclavicular, and axillary nodes normal. No abnormal inguinal nodes palpated Neurologic: Grossly normal  Pelvic: External genitalia:  no lesions              Urethra:  normal appearing urethra with no masses, tenderness or lesions              Bartholins and Skenes: normal                 Vagina: normal appearing vagina with normal color and discharge, no lesions              Cervix: no lesions              Pap taken:  Yes.   Bimanual Exam:  Uterus:  normal size, contour, position, consistency, mobility, non-tender              Adnexa: no mass, fullness, tenderness              Rectal exam: Yes.  .  Confirms.              Anus:  normal sphincter tone, no lesions  Chaperone was present for exam.  Assessment:   Well woman visit with normal exam. HRT patient.  Situational stress.   Plan: Yearly mammogram recommended after age 21.  Recommended self breast exam.  Pap and HR HPV as above. Discussed Calcium, Vitamin D, regular exercise program including cardiovascular and weight bearing exercise. Continue HRT.  Refills for one year.  Risks and benefits have been discussed at length with patient in past.  See PCP for a routine visit, labs, and vaccine update.  Gave her name and number for psychologists for counseling.  Follow up annually and prn.       After visit summary provided.

## 2015-09-09 NOTE — Patient Instructions (Signed)

## 2015-09-13 ENCOUNTER — Ambulatory Visit: Payer: BC Managed Care – PPO | Admitting: Obstetrics and Gynecology

## 2015-09-14 LAB — IPS PAP TEST WITH HPV

## 2015-12-29 ENCOUNTER — Other Ambulatory Visit: Payer: Self-pay | Admitting: Obstetrics and Gynecology

## 2015-12-29 DIAGNOSIS — Z1231 Encounter for screening mammogram for malignant neoplasm of breast: Secondary | ICD-10-CM

## 2016-02-04 ENCOUNTER — Ambulatory Visit: Payer: BC Managed Care – PPO

## 2016-03-03 ENCOUNTER — Ambulatory Visit
Admission: RE | Admit: 2016-03-03 | Discharge: 2016-03-03 | Disposition: A | Discharge status: Home / Self Care | Payer: BC Managed Care – PPO | Source: Ambulatory Visit | Attending: Obstetrics and Gynecology | Admitting: Obstetrics and Gynecology

## 2016-03-03 DIAGNOSIS — Z1231 Encounter for screening mammogram for malignant neoplasm of breast: Secondary | ICD-10-CM

## 2016-03-31 DIAGNOSIS — M6780 Other specified disorders of synovium and tendon, unspecified site: Secondary | ICD-10-CM | POA: Insufficient documentation

## 2016-03-31 DIAGNOSIS — G8929 Other chronic pain: Secondary | ICD-10-CM | POA: Insufficient documentation

## 2016-03-31 DIAGNOSIS — M25552 Pain in left hip: Secondary | ICD-10-CM

## 2016-08-29 ENCOUNTER — Other Ambulatory Visit: Payer: Self-pay | Admitting: Obstetrics and Gynecology

## 2016-08-29 MED ORDER — PROGESTERONE MICRONIZED 100 MG PO CAPS: 100.0000 mg | ORAL_CAPSULE | Freq: Every day | ORAL | 0 refills | Status: DC

## 2016-08-29 NOTE — Telephone Encounter (Signed)
Medication refill request: climara  Last AEX:  09/09/15 Dr. Quincy Simmonds  Next AEX: 09/15/16  Last MMG (if hormonal medication request): 03/03/16 BIRADS1:neg  Refill authorized: 09/09/15 #4patch/11R.   Today please advise.

## 2016-09-14 NOTE — Progress Notes (Signed)
61 y.o. G6P0000 Divorced Caucasian female here for annual exam.    On HRT.  Wants to continue.  No vaginal bleeding.   Hx of left hip pain and pain down into her left leg. Went to [facility] and has a collapsed disc in her lower spine.  Had 2 steroid injections this summer.   PCP:  Dr. Anastasia Pall   Patient's last menstrual period was 01/16/2013.           Sexually active: Yes.    The current method of family planning is post menopausal status.    Exercising: Yes.    aerobics, yoga, and weight training Smoker:  no  Health Maintenance: Pap: 09/10/15 Pap and HR HPV negative  08-05-12 Neg:Neg HR HPV History of abnormal Pap:  no MMG:  03/03/16 BIRADS 1 negative/density d Colonoscopy:  10-25-09 normal Dr. Herbie [place] Kaplan;next due 10/2019 BMD:   2001  Result  normal TDaP: 12/23/15 done with PCP Gardasil:   N/A HIV: never Hep C: 12/23/15 negative Screening Labs:  Done with PCP   reports that she has never smoked. She has never used smokeless tobacco. She reports that she does not drink alcohol or use drugs.  Past Medical History:  Diagnosis Date  . Fibroid     Past Surgical History:  Procedure Laterality Date  . DILATATION & CURRETTAGE/HYSTEROSCOPY WITH RESECTOCOPE N/A 09/11/2012   Procedure: DILATATION & CURETTAGE/HYSTEROSCOPY WITH RESECTOCOPE;  Surgeon: Arloa Koh, MD;  Location: [facility];  Service: Gynecology;  Laterality: N/A;  . knee surgery  1976; 2005,2004,2003   6 knee surgeries total - all right knee  . WISDOM TOOTH EXTRACTION      Current Outpatient Prescriptions  Medication Sig Dispense Refill  . cholecalciferol (VITAMIN D) 1000 UNITS tablet Take 1,000 Units by mouth daily.    Marland Kitchen EPINEPHrine 0.3 mg/0.3 mL IJ SOAJ injection as needed.    Marland Kitchen estradiol (CLIMARA - DOSED IN MG/24 HR) 0.0375 mg/24hr patch APPLY 1 PATCH ONTO THE SKIN ONCE A WEEK 4 patch 0  . Multiple Vitamin (MULTI VITAMIN DAILY PO) Take by mouth.    . Omega-3 Fatty Acids (FISH OIL PO)  Take by mouth daily.     . progesterone (PROMETRIUM) 100 MG capsule Take 1 capsule (100 mg total) by mouth daily. 30 capsule 0   No current facility-administered medications for this visit.     Family History  Problem Relation Age of Onset  . Thyroid disease Mother   . Hypertension Mother   . Diabetes Father   . Heart disease Father   . Liver disease Father   . Lymphoma Father     ROS:  Pertinent items are noted in HPI.  Otherwise, a comprehensive ROS was negative.  Exam:   BP (!) 100/58 (BP Location: Right Arm, Patient Position: Sitting, Cuff Size: Normal)   Pulse 88   Resp 16   Ht 5' 4.5" (1.638 m)   Wt 132 lb (59.9 kg)   LMP 01/16/2013   BMI 22.31 kg/m     General appearance: alert, cooperative and appears stated age Head: Normocephalic, without obvious abnormality, atraumatic Neck: no adenopathy, supple, symmetrical, trachea midline and thyroid normal to inspection and palpation Lungs: clear to auscultation bilaterally Breasts: normal appearance, no masses or tenderness, No nipple retraction or dimpling, No nipple discharge or bleeding, No axillary or supraclavicular adenopathy Heart: regular rate and rhythm Abdomen: soft, non-tender; no masses, no organomegaly Extremities: extremities normal, atraumatic, no cyanosis or edema Skin: Skin color,  texture, turgor normal. No rashes or lesions Lymph nodes: Cervical, supraclavicular, and axillary nodes normal. No abnormal inguinal nodes palpated Neurologic: Grossly normal  Pelvic: External genitalia:  no lesions              Urethra:  normal appearing urethra with no masses, tenderness or lesions              Bartholins and Skenes: normal                 Vagina: normal appearing vagina with normal color and discharge, no lesions              Cervix: no lesions              Pap taken: No. Bimanual Exam:  Uterus:  normal size, contour, position, consistency, mobility, non-tender              Adnexa: no mass, fullness,  tenderness              Rectal exam: Yes.  .  Confirms.              Anus:  normal sphincter tone, no lesions  Chaperone was present for exam.  Assessment:   Well woman visit with normal exam. Compressed disc in her spine.  On HRT. Hx 2 small fibroids.   Plan: Mammogram screening discussed. Recommended self breast awareness. Pap and HR HPV as above. Guidelines for Calcium, Vitamin D, regular exercise program including cardiovascular and weight bearing exercise. Continue HRT for one year.  Discussed WHI and risks of breast cancer, DVT, PE, stroke, and MI.   Follow up annually and prn.    After visit summary provided.

## 2016-09-15 ENCOUNTER — Encounter: Payer: Self-pay | Admitting: Obstetrics and Gynecology

## 2016-09-15 ENCOUNTER — Ambulatory Visit (INDEPENDENT_AMBULATORY_CARE_PROVIDER_SITE_OTHER): Payer: BC Managed Care – PPO | Admitting: Obstetrics and Gynecology

## 2016-09-15 VITALS — BP 100/58 | HR 88 | Resp 16 | Ht 64.5 in | Wt 132.0 lb

## 2016-09-15 DIAGNOSIS — Z01419 Encounter for gynecological examination (general) (routine) without abnormal findings: Secondary | ICD-10-CM

## 2016-09-15 MED ORDER — PROGESTERONE MICRONIZED 100 MG PO CAPS: 100.0000 mg | ORAL_CAPSULE | Freq: Every day | ORAL | 11 refills | Status: DC

## 2016-09-15 MED ORDER — ESTRADIOL 0.0375 MG/24HR TD PTWK: MEDICATED_PATCH | TRANSDERMAL | 11 refills | Status: DC

## 2016-09-15 NOTE — Patient Instructions (Signed)

## 2016-09-29 ENCOUNTER — Other Ambulatory Visit: Payer: Self-pay | Admitting: Obstetrics and Gynecology

## 2017-02-19 ENCOUNTER — Other Ambulatory Visit: Payer: Self-pay | Admitting: Obstetrics and Gynecology

## 2017-02-19 DIAGNOSIS — Z139 Encounter for screening, unspecified: Secondary | ICD-10-CM

## 2017-03-12 ENCOUNTER — Ambulatory Visit: Payer: BC Managed Care – PPO

## 2017-04-16 ENCOUNTER — Ambulatory Visit: Payer: BC Managed Care – PPO

## 2017-05-03 ENCOUNTER — Ambulatory Visit
Admission: RE | Admit: 2017-05-03 | Discharge: 2017-05-03 | Disposition: A | Discharge status: Home / Self Care | Payer: BC Managed Care – PPO | Source: Ambulatory Visit | Attending: Obstetrics and Gynecology | Admitting: Obstetrics and Gynecology

## 2017-05-03 DIAGNOSIS — Z139 Encounter for screening, unspecified: Secondary | ICD-10-CM

## 2017-06-24 ENCOUNTER — Other Ambulatory Visit: Payer: Self-pay | Admitting: Obstetrics and Gynecology

## 2017-06-25 ENCOUNTER — Other Ambulatory Visit: Payer: Self-pay | Admitting: Obstetrics and Gynecology

## 2017-06-25 NOTE — Telephone Encounter (Signed)
Medication refill request: Climara Patch   Last AEX:  09-15-16  Next AEX: 09-28-17  Last MMG (if hormonal medication request): 05-04-17 WNL  Refill authorized: please advise

## 2017-09-14 ENCOUNTER — Other Ambulatory Visit: Payer: Self-pay | Admitting: Obstetrics and Gynecology

## 2017-09-14 NOTE — Telephone Encounter (Signed)
Medication refill request: Climara Last AEX:  09/15/16 BS Next AEX: 09/28/17 Last MMG (if hormonal medication request): 05/03/17 BIRADS 1 negative/density c Refill authorized: 06/26/17 #4 w/2 refills; please advise on refill until appointment

## 2017-09-27 NOTE — Progress Notes (Signed)
62 y.o. G63P0000 Divorced Caucasian female here for annual exam.    Feeling great.   Sleeping well at night and doing well on HRT.  No night sweats.  No vaginal bleeding.   Had normal labs with PCP.   Retired and went to [place] for 3 weeks.   PCP  - Dr Melford Aase    Patient's last menstrual period was 01/16/2013.     Period Cycle (Days): (post menopausal)     Sexually active: Yes.    The current method of family planning is post menopausal status.    Exercising: Yes.    walking Smoker:  no  Health Maintenance: Pap:  09/10/2015 History of abnormal Pap:  no MMG:  05/03/2017 BI-RADS CATEGORY  1: Negative. Colonoscopy:  2011 follow up 10 years BMD:  never   Result  n/a TDaP:  2017 Gardasil:   never HIV: Hep C: 2018 negative per patient Screening Labs:   ----   reports that she has never smoked. She has never used smokeless tobacco. She reports that she does not drink alcohol or use drugs.  Past Medical History:  Diagnosis Date  . Fibroid     Past Surgical History:  Procedure Laterality Date  . DILATATION & CURRETTAGE/HYSTEROSCOPY WITH RESECTOCOPE N/A 09/11/2012   Procedure: DILATATION & CURETTAGE/HYSTEROSCOPY WITH RESECTOCOPE;  Surgeon: Arloa Koh, MD;  Location: [facility];  Service: Gynecology;  Laterality: N/A;  . knee surgery  1976; 2005,2004,2003   6 knee surgeries total - all right knee  . WISDOM TOOTH EXTRACTION      Current Outpatient Medications  Medication Sig Dispense Refill  . cholecalciferol (VITAMIN D) 1000 UNITS tablet Take 1,000 Units by mouth daily.    Marland Kitchen EPINEPHrine 0.3 mg/0.3 mL IJ SOAJ injection as needed.    Marland Kitchen estradiol (CLIMARA - DOSED IN MG/24 HR) 0.0375 mg/24hr patch APPLY 1 PATCH ONTO THE SKIN ONCE A WEEK 4 patch 0  . Multiple Vitamin (MULTI VITAMIN DAILY PO) Take by mouth.    . Omega-3 Fatty Acids (FISH OIL PO) Take by mouth daily.     . progesterone (PROMETRIUM) 100 MG capsule Take 1 capsule (100 mg total) by mouth daily. 30 capsule 11   No  current facility-administered medications for this visit.     Family History  Problem Relation Age of Onset  . Thyroid disease Mother   . Hypertension Mother   . Diabetes Father   . Heart disease Father   . Liver disease Father   . Lymphoma Father     Review of Systems  Constitutional: Negative.   HENT: Negative.   Eyes: Negative.   Respiratory: Negative.   Cardiovascular: Negative.   Gastrointestinal: Negative.   Endocrine: Negative.   Genitourinary: Negative.   Musculoskeletal: Negative.   Skin: Negative.   Allergic/Immunologic: Negative.   Neurological: Negative.   Hematological: Negative.   Psychiatric/Behavioral: Negative.   All other systems reviewed and are negative.   Exam:   BP 118/64   Pulse 68   Ht 5' 4.5" (1.638 m)   Wt 134 lb (60.8 kg)   LMP 01/16/2013   BMI 22.65 kg/m     General appearance: alert, cooperative and appears stated age Head: Normocephalic, without obvious abnormality, atraumatic Neck: no adenopathy, supple, symmetrical, trachea midline and thyroid normal to inspection and palpation Lungs: clear to auscultation bilaterally Breasts: normal appearance, no masses or tenderness, No nipple retraction or dimpling, No nipple discharge or bleeding, No axillary or supraclavicular adenopathy Heart: regular rate and rhythm Abdomen:  soft, non-tender; no masses, no organomegaly Extremities: extremities normal, atraumatic, no cyanosis or edema Skin: Skin color, texture, turgor normal. No rashes or lesions Lymph nodes: Cervical, supraclavicular, and axillary nodes normal. No abnormal inguinal nodes palpated Neurologic: Grossly normal  Pelvic: External genitalia:  no lesions              Urethra:  normal appearing urethra with no masses, tenderness or lesions              Bartholins and Skenes: normal                 Vagina: normal appearing vagina with normal color and discharge, no lesions              Cervix: no lesions              Pap taken:  No. Bimanual Exam:  Uterus:  normal size, contour, position, consistency, mobility, non-tender              Adnexa: no mass, fullness, tenderness              Rectal exam: Yes.  .  Confirms.              Anus:  normal sphincter tone, no lesions  Chaperone was present for exam.  Assessment:   Well woman visit with normal exam. On HRT.  2 small fibroids.   Plan: Mammogram screening. Recommended self breast awareness. Pap and HR HPV as above. Guidelines for Calcium, Vitamin D, regular exercise program including cardiovascular and weight bearing exercise. BMD next year with mammogram.  Will get copy of labs from PCP for 2018.  Continue HRT.  We reviewed WHI and risks of stroke, MI, DVT, PE, and breast cancer.  Follow up annually and prn.    After visit summary provided.

## 2017-09-28 ENCOUNTER — Encounter: Payer: Self-pay | Admitting: Obstetrics and Gynecology

## 2017-09-28 ENCOUNTER — Ambulatory Visit: Payer: BC Managed Care – PPO | Admitting: Obstetrics and Gynecology

## 2017-09-28 VITALS — BP 118/64 | HR 68 | Ht 64.5 in | Wt 134.0 lb

## 2017-09-28 DIAGNOSIS — Z780 Asymptomatic menopausal state: Secondary | ICD-10-CM | POA: Diagnosis not present

## 2017-09-28 DIAGNOSIS — Z01419 Encounter for gynecological examination (general) (routine) without abnormal findings: Secondary | ICD-10-CM

## 2017-09-28 MED ORDER — ESTRADIOL 0.0375 MG/24HR TD PTWK: MEDICATED_PATCH | TRANSDERMAL | 3 refills | Status: DC

## 2017-09-28 MED ORDER — PROGESTERONE MICRONIZED 100 MG PO CAPS: 100.0000 mg | ORAL_CAPSULE | Freq: Every day | ORAL | 3 refills | Status: DC

## 2017-09-28 NOTE — Patient Instructions (Signed)

## 2017-10-04 ENCOUNTER — Telehealth: Payer: Self-pay | Admitting: Obstetrics and Gynecology

## 2017-10-04 NOTE — Telephone Encounter (Signed)
Routing to Dr. Quincy Simmonds.

## 2017-10-04 NOTE — Telephone Encounter (Signed)
Dr. Quincy Simmonds has cosigned order.  Will close encounter.

## 2017-10-04 NOTE — Telephone Encounter (Signed)
[facility] is calling asking for BMD order in epic to be signed by Dr.Silva. The patient's appointment is tomorrow.

## 2017-10-04 NOTE — Telephone Encounter (Signed)
Action Created on Order Mode Entered by Comment Responsible Provider Signed by Signed on Ordering 10/04/17 1535 Verbal with Read Back: Cosign Required Arlis Porta, MD Nunzio Cobbs, MD 10/04/17 1538

## 2017-10-05 ENCOUNTER — Ambulatory Visit
Admission: RE | Admit: 2017-10-05 | Discharge: 2017-10-05 | Disposition: A | Discharge status: Home / Self Care | Payer: BC Managed Care – PPO | Source: Ambulatory Visit | Attending: Obstetrics and Gynecology | Admitting: Obstetrics and Gynecology

## 2017-10-05 DIAGNOSIS — Z780 Asymptomatic menopausal state: Secondary | ICD-10-CM

## 2018-03-05 ENCOUNTER — Other Ambulatory Visit: Payer: Self-pay | Admitting: Obstetrics and Gynecology

## 2018-03-05 DIAGNOSIS — Z1231 Encounter for screening mammogram for malignant neoplasm of breast: Secondary | ICD-10-CM

## 2018-05-07 ENCOUNTER — Ambulatory Visit: Payer: BC Managed Care – PPO

## 2018-09-13 ENCOUNTER — Other Ambulatory Visit: Payer: Self-pay

## 2018-09-13 ENCOUNTER — Ambulatory Visit
Admission: RE | Admit: 2018-09-13 | Discharge: 2018-09-13 | Disposition: A | Discharge status: Home / Self Care | Payer: BC Managed Care – PPO | Source: Ambulatory Visit | Attending: Obstetrics and Gynecology | Admitting: Obstetrics and Gynecology

## 2018-09-13 DIAGNOSIS — Z1231 Encounter for screening mammogram for malignant neoplasm of breast: Secondary | ICD-10-CM

## 2018-09-26 ENCOUNTER — Other Ambulatory Visit: Payer: Self-pay | Admitting: Obstetrics and Gynecology

## 2018-09-26 MED ORDER — ESTRADIOL 0.0375 MG/24HR TD PTWK: MEDICATED_PATCH | TRANSDERMAL | 0 refills | Status: DC

## 2018-09-26 NOTE — Telephone Encounter (Signed)
Medication refill request: Estradiol patch Last AEX:  09/28/17 BS Next AEX: 10/02/18 Last MMG (if hormonal medication request): 09/13/18 BIRADS 1 negative/density c Refill authorized: Please advise; order pended #12 w/0 refills if authorized

## 2018-09-26 NOTE — Telephone Encounter (Signed)
Patient is asking for a refill of her estradiol. [facility], [place], [place].

## 2018-09-30 ENCOUNTER — Other Ambulatory Visit: Payer: Self-pay

## 2018-10-02 ENCOUNTER — Ambulatory Visit: Payer: BC Managed Care – PPO | Admitting: Obstetrics and Gynecology

## 2018-10-02 ENCOUNTER — Other Ambulatory Visit: Payer: Self-pay

## 2018-10-02 ENCOUNTER — Encounter: Payer: Self-pay | Admitting: Obstetrics and Gynecology

## 2018-10-02 VITALS — BP 130/78 | HR 70 | Temp 97.7°F | Resp 14 | Ht 65.0 in | Wt 138.6 lb

## 2018-10-02 DIAGNOSIS — Z01419 Encounter for gynecological examination (general) (routine) without abnormal findings: Secondary | ICD-10-CM

## 2018-10-02 MED ORDER — PROGESTERONE MICRONIZED 100 MG PO CAPS: 100.0000 mg | ORAL_CAPSULE | Freq: Every day | ORAL | 3 refills | Status: DC

## 2018-10-02 MED ORDER — ESTRADIOL 0.0375 MG/24HR TD PTWK: MEDICATED_PATCH | TRANSDERMAL | 3 refills | Status: DC

## 2018-10-02 NOTE — Progress Notes (Signed)
63 y.o. G0P0000 Divorced Caucasian female here for annual exam.    Notes decreased libido.  No pain.  Can enjoy intimacy.   She wants to continue her HRT if it is ok to do so.  Family affected by Covid.  Everybody ok.   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  Likes to ride bicycles. Her dog is now a therapy dog but she is not able to do therapy care due to the pandemic.  PCP: Anastasia Pall, MD  Patient's last menstrual period was 01/16/2013.           Sexually active: Yes.    The current method of family planning is post menopausal status.    Exercising: Yes.    Yard work, walking and biking Smoker:  no  Health Maintenance: Pap: 09-10-15 Neg:Neg HR HPV, 08-05-12 Neg:Neg HR HPV  History of abnormal Pap:  no MMG: 09-13-18 3D Neg/density C/Birads1 Colonoscopy:  2011 normal;next 10 years  BMD: 10-05-17   Result :Normal TDaP:  2017 Gardasil:   n/a HIV:no Hep C:2018 Neg per patient Screening Labs:   PCP.   reports that she has never smoked. She has never used smokeless tobacco. She reports that she does not drink alcohol or use drugs.  Past Medical History:  Diagnosis Date  . Fibroid     Past Surgical History:  Procedure Laterality Date  . DILATATION & CURRETTAGE/HYSTEROSCOPY WITH RESECTOCOPE N/A 09/11/2012   Procedure: DILATATION & CURETTAGE/HYSTEROSCOPY WITH RESECTOCOPE;  Surgeon: Arloa Koh, MD;  Location: [facility];  Service: Gynecology;  Laterality: N/A;  . knee surgery  1976; 2005,2004,2003   6 knee surgeries total - all right knee  . WISDOM TOOTH EXTRACTION      Current Outpatient Medications  Medication Sig Dispense Refill  . cholecalciferol (VITAMIN D) 1000 UNITS tablet Take 1,000 Units by mouth daily.    Marland Kitchen EPINEPHrine 0.3 mg/0.3 mL IJ SOAJ injection as needed.    Marland Kitchen estradiol (CLIMARA - DOSED IN MG/24 HR) 0.0375 mg/24hr patch Place on skin once weekly. 8 patch 0  . Multiple Vitamin (MULTI VITAMIN DAILY PO) Take by mouth.    . Omega-3 Fatty Acids (FISH OIL PO) Take  by mouth daily.     . progesterone (PROMETRIUM) 100 MG capsule Take 1 capsule (100 mg total) by mouth daily. 90 capsule 3   No current facility-administered medications for this visit.     Family History  Problem Relation Age of Onset  . Thyroid disease Mother   . Hypertension Mother   . Diabetes Father   . Heart disease Father   . Liver disease Father   . Lymphoma Father     Review of Systems  All other systems reviewed and are negative.   Exam:   BP 130/78   Pulse 70   Temp 97.7 F (36.5 C) (Temporal)   Resp 14   Ht 5\' 5"  (1.651 m)   Wt 138 lb 9.6 oz (62.9 kg)   LMP 01/16/2013   BMI 23.06 kg/m     General appearance: alert, cooperative and appears stated age Head: normocephalic, without obvious abnormality, atraumatic Neck: no adenopathy, supple, symmetrical, trachea midline and thyroid normal to inspection and palpation Lungs: clear to auscultation bilaterally Breasts: normal appearance, no masses or tenderness, No nipple retraction or dimpling, No nipple discharge or bleeding, No axillary adenopathy Heart: regular rate and rhythm Abdomen: soft, non-tender; no masses, no organomegaly Extremities: extremities normal, atraumatic, no cyanosis or edema Skin: skin color, texture, turgor normal. No rashes  or lesions Lymph nodes: cervical, supraclavicular, and axillary nodes normal. Neurologic: grossly normal  Pelvic: External genitalia:  no lesions              No abnormal inguinal nodes palpated.              Urethra:  normal appearing urethra with no masses, tenderness or lesions              Bartholins and Skenes: normal                 Vagina: normal appearing vagina with normal color and discharge, no lesions              Cervix: no lesions              Pap taken: No. Bimanual Exam:  Uterus:  normal size, contour, position, consistency, mobility, non-tender              Adnexa: no mass, fullness, tenderness              Rectal exam: Yes.  .  Confirms.               Anus:  normal sphincter tone, no lesions  Chaperone was present for exam.  Assessment:   Well woman visit with normal exam. On HRT.  2 small fibroids. Decreased libido.  Plan: Mammogram screening discussed. Self breast awareness reviewed. Pap and HR HPV as above. Guidelines for Calcium, Vitamin D, regular exercise program including cardiovascular and weight bearing exercise. Continue HRT.  We discussed potential risks of cardiovascular events and breast cancer.  We reviewed Wellbutrin and testosterone therapy for treatment of decreased libido.  She will consider.  Follow up annually and prn.   After visit summary provided.

## 2018-10-02 NOTE — Patient Instructions (Signed)

## 2019-02-24 ENCOUNTER — Telehealth: Payer: Self-pay | Admitting: Obstetrics and Gynecology

## 2019-02-24 NOTE — Telephone Encounter (Signed)
Left message for pt to call back to triage RN to schedule OV with Dr Quincy Simmonds on 02/9 at 4:30pm.

## 2019-02-24 NOTE — Telephone Encounter (Signed)
Patient is having post menopausal bleeding.

## 2019-02-24 NOTE — Telephone Encounter (Signed)
Is is possible for the patient to come tomorrow at 4:30 instead? I am trying to plan ahead for potential ultrasound for her later this week. I need to see her first.

## 2019-02-24 NOTE — Telephone Encounter (Signed)
Pt aware to be here 2/9 at 430 pm for OV with Dr Quincy Simmonds.   Routing to Dr Quincy Simmonds for review and will close encounter.

## 2019-02-24 NOTE — Telephone Encounter (Signed)
Spoke to pt. Pt states having PMB for last  3-4 days. Pt denies pain or any other sx. Pt states having bleeding last month too, but also having UTI at the same time by PCP.  Pt has fibroid. Pt states bleeding happening after intercourse. Pt only wearing light pad. Pt scheduled for OV on 02/27/2019 at 4pm with Dr Quincy Simmonds. Pt agreeable.   Routing to Dr Quincy Simmonds for review and will close encounter.

## 2019-02-24 NOTE — Telephone Encounter (Signed)
Patient returned call. She can come in at 4:30 tomorrow instead of Thursday. She is aware to be here at 4:15.

## 2019-02-25 ENCOUNTER — Ambulatory Visit: Payer: BC Managed Care – PPO | Admitting: Obstetrics and Gynecology

## 2019-02-25 ENCOUNTER — Other Ambulatory Visit: Payer: Self-pay

## 2019-02-25 ENCOUNTER — Encounter: Payer: Self-pay | Admitting: Obstetrics and Gynecology

## 2019-02-25 VITALS — BP 120/70 | HR 80 | Temp 97.3°F | Ht 65.0 in | Wt 140.0 lb

## 2019-02-25 DIAGNOSIS — N950 Postmenopausal bleeding: Secondary | ICD-10-CM

## 2019-02-25 DIAGNOSIS — Z124 Encounter for screening for malignant neoplasm of cervix: Secondary | ICD-10-CM | POA: Diagnosis not present

## 2019-02-25 NOTE — Progress Notes (Signed)
GYNECOLOGY  VISIT   HPI: 64 y.o.   Divorced  Caucasian  female   [place] with Patient's last menstrual period was 01/16/2013.   here for postmenopausal bleeding.  In beginning of December, she had some spotting.  She was not sure if it was vaginal or urine as she was having some urinary urgency.  She went to her PCP office and received dx of UTI and Rx for abx and yeast infection.   Now she is having bleeding again, but worse.  Bleeding was more red and occurring for 4 days.  Notices clotting.  No pain or cramping.  She notes the bleeding following intercourse.  In retrospect, she notes that she had bleeding in December following intercourse.   She is on HRT.  No missed doses.   GYNECOLOGIC HISTORY: Patient's last menstrual period was 01/16/2013. Contraception: PMP Menopausal hormone therapy: Climara patch and Progesterone 100mg  Last mammogram:  09-13-18 3D Neg/density C/Birads1 Last pap smear:  09-10-15 Neg:Neg HR HPV, 08-05-12 Neg:Neg HR HPV         OB History    Gravida  0   Para  0   Term  0   Preterm  0   AB  0   Living  0     SAB  0   TAB  0   Ectopic  0   Multiple  0   Live Births                 Patient Active Problem List   Diagnosis Date Noted  . Chronic left hip pain 03/31/2016  . 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 Hamstring tendonitis at origin 11/06/2014  . Ischial bursitis of left side 11/06/2014  . Lumbar radiculopathy 11/06/2014  . Neck pain 07/31/2013  . Post menopausal syndrome 07/31/2013    Past Medical History:  Diagnosis Date  . Fibroid     Past Surgical History:  Procedure Laterality Date  . DILATATION & CURRETTAGE/HYSTEROSCOPY WITH RESECTOCOPE N/A 09/11/2012   Procedure: DILATATION & CURETTAGE/HYSTEROSCOPY WITH RESECTOCOPE;  Surgeon: Arloa Koh, MD;  Location: [facility];  Service: Gynecology;  Laterality: N/A;  . knee surgery  1976; 2005,2004,2003   6 knee surgeries total - all right knee  . WISDOM TOOTH EXTRACTION       Current Outpatient Medications  Medication Sig Dispense Refill  . cholecalciferol (VITAMIN D) 1000 UNITS tablet Take 1,000 Units by mouth daily.    Marland Kitchen EPINEPHrine 0.3 mg/0.3 mL IJ SOAJ injection as needed.    Marland Kitchen estradiol (CLIMARA - DOSED IN MG/24 HR) 0.0375 mg/24hr patch Place on skin once weekly. 24 patch 3  . Multiple Vitamin (MULTI VITAMIN DAILY PO) Take by mouth.    . Omega-3 Fatty Acids (FISH OIL PO) Take by mouth daily.     . progesterone (PROMETRIUM) 100 MG capsule Take 1 capsule (100 mg total) by mouth daily. 90 capsule 3  . tretinoin (RETIN-A) 0.025 % cream Apply 1 application topically at bedtime.     No current facility-administered medications for this visit.     ALLERGIES: Sodium hyaluronate, Cross-linked hyaluronate, Other, and Shrimp [shellfish allergy]  Family History  Problem Relation Age of Onset  . Thyroid disease Mother   . Hypertension Mother   . Diabetes Father   . Heart disease Father   . Liver disease Father   . Lymphoma Father     Social History   Socioeconomic History  . Marital status: Divorced    Spouse name: Not on file  .  Number of children: Not on file  . Years of education: Not on file  . Highest education level: Not on file  Occupational History  . Not on file  Tobacco Use  . Smoking status: Never Smoker  . Smokeless tobacco: Never Used  Substance and Sexual Activity  . Alcohol use: No    Comment: occ glass of wine  . Drug use: No  . Sexual activity: Yes    Partners: Male    Birth control/protection: Post-menopausal  Other Topics Concern  . Not on file  Social History Narrative  . Not on file   Social Determinants of Health   Financial Resource Strain:   . Difficulty of Paying Living Expenses: Not on file  Food Insecurity:   . Worried About Charity fundraiser in the Last Year: Not on file  . Ran Out of Food in the Last Year: Not on file  Transportation Needs:   . Lack of Transportation (Medical): Not on file  . Lack of  Transportation (Non-Medical): Not on file  Physical Activity:   . Days of Exercise per Week: Not on file  . Minutes of Exercise per Session: Not on file  Stress:   . Feeling of Stress : Not on file  Social Connections:   . Frequency of Communication with Friends and Family: Not on file  . Frequency of Social Gatherings with Friends and Family: Not on file  . Attends Religious Services: Not on file  . Active Member of Clubs or Organizations: Not on file  . Attends Archivist Meetings: Not on file  . Marital Status: Not on file  Intimate Partner Violence:   . Fear of Current or Ex-Partner: Not on file  . Emotionally Abused: Not on file  . Physically Abused: Not on file  . Sexually Abused: Not on file    Review of Systems  All other systems reviewed and are negative.   PHYSICAL EXAMINATION:    BP 120/70   Pulse 80   Temp (!) 97.3 F (36.3 C) (Temporal)   Ht 5\' 5"  (1.651 m)   Wt 140 lb (63.5 kg)   LMP 01/16/2013   BMI 23.30 kg/m     General appearance: alert, cooperative and appears stated age  Pelvic: External genitalia:  no lesions              Urethra:  normal appearing urethra with no masses, tenderness or lesions              Bartholins and Skenes: normal                 Vagina: normal appearing vagina with normal color and discharge, no lesions              Cervix: no lesions.  Red blood noted.                 Bimanual Exam:  Uterus:  normal size, contour, position, consistency, mobility, non-tender              Adnexa: no mass, fullness, tenderness      Chaperone was present for exam.  ASSESSMENT  Postmenopausal bleeding on HRT.  Status post hysteroscopy with resection of fibroid, dilation and curettage.   PLAN  We talked about postmenopausal bleeding and potential etiologies - atrophy, HRT, infection, polyp, fibroids,hyperplasia, and malignancy.  Pap and HR HPV collected today.  OK to continue HRT for now. Return for pelvic US, sonohysterogram,  and endometrial biopsy.  An After Visit Summary was printed and given to the patient.  ___25___ minutes face to face time of which over 50% was spent in counseling.

## 2019-02-25 NOTE — Patient Instructions (Signed)
Postmenopausal Bleeding  Postmenopausal bleeding is any bleeding that a woman has after she has entered into menopause. Menopause is the end of a woman's fertile years. After menopause, a woman no longer ovulates and does not have menstrual periods. Postmenopausal bleeding may have various causes, including:  Menopausal hormone therapy (MHT).  Endometrial atrophy. After menopause, low estrogen hormone levels cause the membrane that lines the uterus (endometrium) to become thinner. You may have bleeding as the endometrium thins.  Endometrial hyperplasia. This condition is caused by excess estrogen hormones and low levels of progesterone hormones. The excess estrogen causes the endometrium to thicken, which can lead to bleeding. In some cases, this can lead to cancer of the uterus.  Endometrial cancer.  Non-cancerous growths (polyps) on the endometrium, the lining of the uterus, or the cervix.  Uterine fibroids. These are non-cancerous growths in or around the uterus muscle tissue that can cause heavy bleeding. Any type of postmenopausal bleeding, even if it appears to be a typical menstrual period, should be evaluated by your health care provider. Treatment will depend on the cause of the bleeding. Follow these instructions at home:  Pay attention to any changes in your symptoms.  Avoid using tampons and douches as told by your health care provider.  Change your pads regularly.  Get regular pelvic exams and Pap tests.  Take iron supplements as told by your health care provider.  Take over-the-counter and prescription medicines only as told by your health care provider.  Keep all follow-up visits as told by your health care provider. This is important. Contact a health care provider if:  Your bleeding lasts more than 1 week.  You have abdominal pain.  You have bleeding with or after sexual intercourse.  You have bleeding that happens more often than every 3 weeks. Get help  right away if:  You have a fever, chills, headache, dizziness, muscle aches, and bleeding.  You have severe pain with bleeding.  You are passing blood clots.  You have heavy bleeding, need more than 1 pad an hour, and have never experienced this before.  You feel faint. Summary  Postmenopausal bleeding is any bleeding that a woman has after she has entered into menopause.  Postmenopausal bleeding may have various causes. Treatment will depend on the cause of the bleeding.  Any type of postmenopausal bleeding, even if it appears to be a typical menstrual period, should be evaluated by your health care provider.  Be sure to pay attention to any changes in your symptoms and keep all follow-up visits as told by your health care provider. This information is not intended to replace advice given to you by your health care provider. Make sure you discuss any questions you have with your health care provider. Document Revised: 04/11/2017 Document Reviewed: 03/28/2016 Elsevier Patient Education  2020 Elsevier Inc.  

## 2019-02-26 ENCOUNTER — Other Ambulatory Visit (HOSPITAL_COMMUNITY)
Admission: RE | Admit: 2019-02-26 | Discharge: 2019-02-26 | Disposition: A | Discharge status: Home / Self Care | Payer: BC Managed Care – PPO | Source: Ambulatory Visit | Attending: Obstetrics and Gynecology | Admitting: Obstetrics and Gynecology

## 2019-02-26 ENCOUNTER — Telehealth: Payer: Self-pay | Admitting: *Deleted

## 2019-02-26 DIAGNOSIS — Z124 Encounter for screening for malignant neoplasm of cervix: Secondary | ICD-10-CM | POA: Insufficient documentation

## 2019-02-26 NOTE — Telephone Encounter (Signed)
Call to patient. Per DPR, can leave message on voice mail which has number confirmation. Left message calling to schedule ultrasound and appointments are avail;able as early as tomorrow morning. Left message to call back to [facility] or triage nurse.

## 2019-02-26 NOTE — Telephone Encounter (Signed)
Spoke with patient. SHGM and EMB scheduled for 2/11 at 8am, consult to follow at 8:30am. Advised patient to take Motrin 800 mg with food and water one hour before procedure. Patient notified of benefits and cancellation policy. Patient verbalizes understanding and is agreeable.   Routing to provider for final review. Patient is agreeable to disposition. Will close encounter.  Cc: Magdalene Patricia

## 2019-02-26 NOTE — Telephone Encounter (Signed)
Patient left a message on the answering machine returning a call to triage or Gay Filler.

## 2019-02-26 NOTE — Telephone Encounter (Signed)
Left message to call Weslynn Ke, RN at [facility] [phone].   

## 2019-02-27 ENCOUNTER — Ambulatory Visit: Payer: BC Managed Care – PPO

## 2019-02-27 ENCOUNTER — Other Ambulatory Visit: Payer: Self-pay

## 2019-02-27 ENCOUNTER — Encounter: Payer: Self-pay | Admitting: Obstetrics and Gynecology

## 2019-02-27 ENCOUNTER — Ambulatory Visit: Payer: Self-pay | Admitting: Obstetrics and Gynecology

## 2019-02-27 ENCOUNTER — Other Ambulatory Visit (HOSPITAL_COMMUNITY)
Admission: RE | Admit: 2019-02-27 | Discharge: 2019-02-27 | Disposition: A | Discharge status: Home / Self Care | Payer: BC Managed Care – PPO | Source: Ambulatory Visit | Attending: Obstetrics and Gynecology | Admitting: Obstetrics and Gynecology

## 2019-02-27 ENCOUNTER — Ambulatory Visit (INDEPENDENT_AMBULATORY_CARE_PROVIDER_SITE_OTHER): Payer: BC Managed Care – PPO | Admitting: Obstetrics and Gynecology

## 2019-02-27 ENCOUNTER — Other Ambulatory Visit: Payer: Self-pay | Admitting: Obstetrics and Gynecology

## 2019-02-27 VITALS — BP 120/62 | HR 70 | Temp 97.4°F | Ht 65.0 in | Wt 140.0 lb

## 2019-02-27 DIAGNOSIS — N950 Postmenopausal bleeding: Secondary | ICD-10-CM

## 2019-02-27 DIAGNOSIS — N9489 Other specified conditions associated with female genital organs and menstrual cycle: Secondary | ICD-10-CM

## 2019-02-27 NOTE — Progress Notes (Signed)
Encounter reviewed by Dr. Isidoro Santillana Amundson C. Silva.  

## 2019-02-27 NOTE — Patient Instructions (Signed)

## 2019-02-27 NOTE — Progress Notes (Signed)
GYNECOLOGY  VISIT   HPI: 64 y.o.   Divorced  Caucasian  female   [place] with Patient's last menstrual period was 01/16/2013.   here for sonohysterogram and EMB.   Patient is here for evaluation of postmenopausal bleeding on HRT.   The following is taken from her visit on 02/25/19: In beginning of December, she had some spotting.  She was not sure if it was vaginal or urine as she was having some urinary urgency.  She went to her PCP office and received dx of UTI and Rx for abx and yeast infection.   Now she is having bleeding again, but worse.  Bleeding was more red and occurring for 4 days.  Notices clotting.  No pain or cramping.  She notes the bleeding following intercourse.  In retrospect, she notes that she had bleeding in December following intercourse.   She is on HRT.  No missed doses.   Elderly mother is living at her home until the beginning of April.  GYNECOLOGIC HISTORY: Patient's last menstrual period was 01/16/2013. Contraception:  Postmenopausal Menopausal hormone therapy: Climara patch and Progesterone 100mg  Last mammogram: 09-13-18 3D Neg/density C/Birads1 Last pap smear:02-25-19 pend, 09-10-15 Neg:Neg HR HPV, 08-05-12 Neg:Neg HR HPV        OB History    Gravida  0   Para  0   Term  0   Preterm  0   AB  0   Living  0     SAB  0   TAB  0   Ectopic  0   Multiple  0   Live Births                 Patient Active Problem List   Diagnosis Date Noted  . Chronic left hip pain 03/31/2016  . 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 Hamstring tendonitis at origin 11/06/2014  . Ischial bursitis of left side 11/06/2014  . Lumbar radiculopathy 11/06/2014  . Neck pain 07/31/2013  . Post menopausal syndrome 07/31/2013    Past Medical History:  Diagnosis Date  . Fibroid     Past Surgical History:  Procedure Laterality Date  . DILATATION & CURRETTAGE/HYSTEROSCOPY WITH RESECTOCOPE N/A 09/11/2012   Procedure: DILATATION & CURETTAGE/HYSTEROSCOPY WITH  RESECTOCOPE;  Surgeon: Arloa Koh, MD;  Location: [facility];  Service: Gynecology;  Laterality: N/A;  . knee surgery  1976; 2005,2004,2003   6 knee surgeries total - all right knee  . WISDOM TOOTH EXTRACTION      Current Outpatient Medications  Medication Sig Dispense Refill  . cholecalciferol (VITAMIN D) 1000 UNITS tablet Take 1,000 Units by mouth daily.    Marland Kitchen EPINEPHrine 0.3 mg/0.3 mL IJ SOAJ injection as needed.    Marland Kitchen estradiol (CLIMARA - DOSED IN MG/24 HR) 0.0375 mg/24hr patch Place on skin once weekly. 24 patch 3  . Multiple Vitamin (MULTI VITAMIN DAILY PO) Take by mouth.    . Omega-3 Fatty Acids (FISH OIL PO) Take by mouth daily.     . progesterone (PROMETRIUM) 100 MG capsule Take 1 capsule (100 mg total) by mouth daily. 90 capsule 3  . tretinoin (RETIN-A) 0.025 % cream Apply 1 application topically at bedtime.     No current facility-administered medications for this visit.     ALLERGIES: Sodium hyaluronate, Cross-linked hyaluronate, Other, and Shrimp [shellfish allergy]  Family History  Problem Relation Age of Onset  . Thyroid disease Mother   . Hypertension Mother   . Diabetes Father   . Heart disease Father   .  Liver disease Father   . Lymphoma Father     Social History   Socioeconomic History  . Marital status: Divorced    Spouse name: Not on file  . Number of children: Not on file  . Years of education: Not on file  . Highest education level: Not on file  Occupational History  . Not on file  Tobacco Use  . Smoking status: Never Smoker  . Smokeless tobacco: Never Used  Substance and Sexual Activity  . Alcohol use: No    Comment: occ glass of wine  . Drug use: No  . Sexual activity: Yes    Partners: Male    Birth control/protection: Post-menopausal  Other Topics Concern  . Not on file  Social History Narrative  . Not on file   Social Determinants of Health   Financial Resource Strain:   . Difficulty of Paying Living Expenses: Not on file  Food  Insecurity:   . Worried About Charity fundraiser in the Last Year: Not on file  . Ran Out of Food in the Last Year: Not on file  Transportation Needs:   . Lack of Transportation (Medical): Not on file  . Lack of Transportation (Non-Medical): Not on file  Physical Activity:   . Days of Exercise per Week: Not on file  . Minutes of Exercise per Session: Not on file  Stress:   . Feeling of Stress : Not on file  Social Connections:   . Frequency of Communication with Friends and Family: Not on file  . Frequency of Social Gatherings with Friends and Family: Not on file  . Attends Religious Services: Not on file  . Active Member of Clubs or Organizations: Not on file  . Attends Archivist Meetings: Not on file  . Marital Status: Not on file  Intimate Partner Violence:   . Fear of Current or Ex-Partner: Not on file  . Emotionally Abused: Not on file  . Physically Abused: Not on file  . Sexually Abused: Not on file    Review of Systems  All other systems reviewed and are negative.   PHYSICAL EXAMINATION:    BP 120/62   Pulse 70   Temp (!) 97.4 F (36.3 C) (Temporal)   Ht 5\' 5"  (1.651 m)   Wt 140 lb (63.5 kg)   LMP 01/16/2013   BMI 23.30 kg/m     General appearance: alert, cooperative and appears stated age  Pelvic US  Uterus with 2 small  Fibroids.  Largest 12 mm.  EMS 6.01 mm. 2 echogenic masses - 15 x 13 mm and 9 x 7 mm (likely fibroid). Ovaries normal.  Left paraovarian versus paratubal cyst 13 x 6 mm, echofree. Free fluid -  no  Sonohysterogram/EMB Consent for procedures.  Sterile prep with Hibiclens.  Os finder used to dilate the cervix.  Cannula placed and sterile saline injected.  2 filling defects noted:  13 mm and 7 mm. Cervix reprepped with Hibiclens.  Pipelle introduced to 8 cm x 2.  Tissue to pathology.  No complications.  Minimal EBL.  Chaperone was present for exam.  ASSESSMENT  Postmenopausal bleeding.  Endometrial masses - suspected  polyp and fibroid. HRT.   PLAN  FU EMB.  Instructions and precautions given.  Discussion of postmenopausal bleeding, polyps, fibroids. Discussion of hysteroscopy with Myosure polypectomy/myomectomy, dilation and curettage.  Risks, benefits, and alternatives reviewed. Risks include but are not limited to bleeding, infection, damage to surrounding organs including uterine perforation requiring  hospitalization and laparoscopy, pulmonary edema, reaction to anesthesia, DVT, PE, death, need for further treatment and surgery hysterectomy or medical therapy.   Surgical expectations and recovery discussed.  ACOG HO on hysteroscopy and dilation and curettage.   An After Visit Summary was printed and given to the patient.  _25____ minutes face to face time of which over 50% was spent in counseling.

## 2019-02-28 LAB — SURGICAL PATHOLOGY

## 2019-02-28 LAB — CYTOLOGY - PAP
Comment: NEGATIVE
Diagnosis: NEGATIVE
High risk HPV: NEGATIVE

## 2019-03-03 ENCOUNTER — Telehealth: Payer: Self-pay | Admitting: Obstetrics and Gynecology

## 2019-03-03 NOTE — Telephone Encounter (Signed)
Call to patient. OK to leave message on voicemail per DPR.  Requesting call back to [facility] to go over surgical benefits for a recommended surgery.

## 2019-03-05 NOTE — Telephone Encounter (Signed)
Spoke with patient. Advised will request surgery for 03/17/2019 at [facility] and will return call with date confirmation. Patient is agreeable.

## 2019-03-05 NOTE — Telephone Encounter (Signed)
Spoke with patient. Patient is scheduled for surgery on 03/17/2019 at [facility] at 9:30 am. Patient is scheduled for her COVID test on 03/13/2019 at 2:05 pm and is aware of the need to quarantine after test until surgery. 2 week post op scheduled for 03/31/2019 at 11:30 am. Patient is agreeable to all dates and times. Surgery instructions reviewed and mailed to patient's verified address on file.  Routing to provider and will close encounter.

## 2019-03-05 NOTE — Telephone Encounter (Signed)
Left message to call Lucille Witts at [phone]. 

## 2019-03-05 NOTE — Telephone Encounter (Signed)
Spoke with patient regarding surgery benefits. Patient acknowledges understanding of information presented. Patient is aware that benefits presented are for professional benefits only. Patient is aware that once surgery is scheduled, the hospital will call with separate benefits. Patient is aware of surgery cancellation policy.  Patient is ready to proceed with scheduling. Patient requested 03/17/2019 surgery date. Patient informed that surgery scheduling nurse, Verline Lema, will be giving patient a call back with further details regarding scheduling.

## 2019-03-09 NOTE — H&P (Signed)
Office Visit  02/27/2019 Vieques Silva, Everardo All, MD Obstetrics and Gynecology  Postmenopausal bleeding +1 more Dx  Sonohysterogram & EMB; Referred by Chesley Noon, MD Reason for Visit  Additional Documentation  Vitals:    BP 120/62  Pulse 70  Temp 97.4 F (36.3 C)  (Temporal)  Ht 5\' 5"  (1.651 m)  Wt 63.5 kg  LMP 01/16/2013  BMI 23.30 kg/m  BSA 1.71 m    More Vitals  Flowsheets:    Anthropometrics,  NEWS,  MEWS Score,  Method of Visit    Encounter Info:   Billing Info,  History,  Allergies,  Detailed Report    All Notes   Progress Notes by Nunzio Cobbs, MD at 02/27/2019 8:30 AM Author: Nunzio Cobbs, MD Author Type: Physician Filed: 02/27/2019  9:36 AM  Note Status: Signed Cosign: Cosign Not Required Encounter Date: 02/27/2019  Editor: Nunzio Cobbs, MD (Physician)  Prior Versions: 1. Nunzio Cobbs, MD (Physician) at 02/27/2019  9:30 AM - Sign when Signing Visit   2. Orion Crook, RDMS (Technician) at 02/27/2019  9:05 AM - Sign when Signing Visit    Encounter reviewed by Dr. Aundria Rud.              Progress Notes by Nunzio Cobbs, MD at 02/27/2019 8:30 AM Author: Nunzio Cobbs, MD Author Type: Physician Filed: 02/27/2019  9:36 AM  Note Status: Signed Cosign: Cosign Not Required Encounter Date: 02/27/2019  Editor: Nunzio Cobbs, MD (Physician)  Prior Versions: 1. Lowella Fairy, CMA (Certified Medical Assistant) at 02/27/2019  9:05 AM - Sign when Signing Visit   2. Nunzio Cobbs, MD (Physician) at 02/27/2019  8:59 AM - Sign when Signing Visit   3. Lowella Fairy, CMA (Certified Medical Assistant) at 02/27/2019  8:30 AM - Sign when Signing Visit    GYNECOLOGY  VISIT   HPI: 64 y.o.   Divorced  Caucasian  female   [place] with Patient's last menstrual period was 01/16/2013.   here for  sonohysterogram and EMB.    Patient is here for evaluation of postmenopausal bleeding on HRT.    The following is taken from her visit on 02/25/19: In beginning of December, she had some spotting.  She was not sure if it was vaginal or urine as she was having some urinary urgency.  She went to her PCP office and received dx of UTI and Rx for abx and yeast infection.    Now she is having bleeding again, but worse.  Bleeding was more red and occurring for 4 days.  Notices clotting.  No pain or cramping.  She notes the bleeding following intercourse.  In retrospect, she notes that she had bleeding in December following intercourse.    She is on HRT.  No missed doses.    Elderly mother is living at her home until the beginning of April.   GYNECOLOGIC HISTORY: Patient's last menstrual period was 01/16/2013. Contraception:  Postmenopausal Menopausal hormone therapy: Climara patch and Progesterone 100mg  Last mammogram: 09-13-18 3D Neg/density C/Birads1 Last pap smear:02-25-19 pend, 09-10-15 Neg:Neg HR HPV, 08-05-12 Neg:Neg HR HPV                 OB History     Gravida  0   Para  0   Term  0   Preterm  0   AB  0   Living  0      SAB  0   TAB  0   Ectopic  0   Multiple  0   Live Births                        Patient Active Problem List    Diagnosis Date Noted  . Chronic left hip pain 03/31/2016  . 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 Hamstring tendonitis at origin 11/06/2014  . Ischial bursitis of left side 11/06/2014  . Lumbar radiculopathy 11/06/2014  . Neck pain 07/31/2013  . Post menopausal syndrome 07/31/2013          Past Medical History:  Diagnosis Date  . Fibroid             Past Surgical History:  Procedure Laterality Date  . DILATATION & CURRETTAGE/HYSTEROSCOPY WITH RESECTOCOPE N/A 09/11/2012    Procedure: DILATATION & CURETTAGE/HYSTEROSCOPY WITH RESECTOCOPE;  Surgeon: Arloa Koh, MD;  Location: [facility];  Service: Gynecology;  Laterality: N/A;  . knee  surgery   1976; 2005,2004,2003    6 knee surgeries total - all right knee  . WISDOM TOOTH EXTRACTION                Current Outpatient Medications  Medication Sig Dispense Refill  . cholecalciferol (VITAMIN D) 1000 UNITS tablet Take 1,000 Units by mouth daily.      Marland Kitchen EPINEPHrine 0.3 mg/0.3 mL IJ SOAJ injection as needed.      Marland Kitchen estradiol (CLIMARA - DOSED IN MG/24 HR) 0.0375 mg/24hr patch Place on skin once weekly. 24 patch 3  . Multiple Vitamin (MULTI VITAMIN DAILY PO) Take by mouth.      . Omega-3 Fatty Acids (FISH OIL PO) Take by mouth daily.       . progesterone (PROMETRIUM) 100 MG capsule Take 1 capsule (100 mg total) by mouth daily. 90 capsule 3  . tretinoin (RETIN-A) 0.025 % cream Apply 1 application topically at bedtime.        No current facility-administered medications for this visit.      ALLERGIES: Sodium hyaluronate, Cross-linked hyaluronate, Other, and Shrimp [shellfish allergy]        Family History  Problem Relation Age of Onset  . Thyroid disease Mother    . Hypertension Mother    . Diabetes Father    . Heart disease Father    . Liver disease Father    . Lymphoma Father        Social History         Socioeconomic History  . Marital status: Divorced      Spouse name: Not on file  . Number of children: Not on file  . Years of education: Not on file  . Highest education level: Not on file  Occupational History  . Not on file  Tobacco Use  . Smoking status: Never Smoker  . Smokeless tobacco: Never Used  Substance and Sexual Activity  . Alcohol use: No      Comment: occ glass of wine  . Drug use: No  . Sexual activity: Yes      Partners: Male      Birth control/protection: Post-menopausal  Other Topics Concern  . Not on file  Social History Narrative  . Not on file    Social Determinants of Health       Financial Resource Strain:   . Difficulty of Paying Living Expenses: Not on  file  Food Insecurity:   . Worried About Charity fundraiser in  the Last Year: Not on file  . Ran Out of Food in the Last Year: Not on file  Transportation Needs:   . Lack of Transportation (Medical): Not on file  . Lack of Transportation (Non-Medical): Not on file  Physical Activity:   . Days of Exercise per Week: Not on file  . Minutes of Exercise per Session: Not on file  Stress:   . Feeling of Stress : Not on file  Social Connections:   . Frequency of Communication with Friends and Family: Not on file  . Frequency of Social Gatherings with Friends and Family: Not on file  . Attends Religious Services: Not on file  . Active Member of Clubs or Organizations: Not on file  . Attends Archivist Meetings: Not on file  . Marital Status: Not on file  Intimate Partner Violence:   . Fear of Current or Ex-Partner: Not on file  . Emotionally Abused: Not on file  . Physically Abused: Not on file  . Sexually Abused: Not on file      Review of Systems  All other systems reviewed and are negative.     PHYSICAL EXAMINATION:     BP 120/62   Pulse 70   Temp (!) 97.4 F (36.3 C) (Temporal)   Ht 5\' 5"  (1.651 m)   Wt 140 lb (63.5 kg)   LMP 01/16/2013   BMI 23.30 kg/m     General appearance: alert, cooperative and appears stated age   Pelvic US  Uterus with 2 small  Fibroids.  Largest 12 mm.  EMS 6.01 mm. 2 echogenic masses - 15 x 13 mm and 9 x 7 mm (likely fibroid). Ovaries normal.  Left paraovarian versus paratubal cyst 13 x 6 mm, echofree. Free fluid -  no   Sonohysterogram/EMB Consent for procedures.  Sterile prep with Hibiclens.  Os finder used to dilate the cervix.  Cannula placed and sterile saline injected.  2 filling defects noted:  13 mm and 7 mm. Cervix reprepped with Hibiclens.  Pipelle introduced to 8 cm x 2.  Tissue to pathology.  No complications.  Minimal EBL.   Chaperone was present for exam.   ASSESSMENT   Postmenopausal bleeding.  Endometrial masses - suspected polyp and fibroid. HRT.    PLAN   FU  EMB.  Instructions and precautions given.  Discussion of postmenopausal bleeding, polyps, fibroids. Discussion of hysteroscopy with Myosure polypectomy/myomectomy, dilation and curettage.  Risks, benefits, and alternatives reviewed. Risks include but are not limited to bleeding, infection, damage to surrounding organs including uterine perforation requiring hospitalization and laparoscopy, pulmonary edema, reaction to anesthesia, DVT, PE, death, need for further treatment and surgery hysterectomy or medical therapy.   Surgical expectations and recovery discussed.  ACOG HO on hysteroscopy and dilation and curettage.   An After Visit Summary was printed and given to the patient.   _25____ minutes face to face time of which over 50% was spent in counseling.

## 2019-03-11 ENCOUNTER — Telehealth: Payer: Self-pay | Admitting: Obstetrics and Gynecology

## 2019-03-11 ENCOUNTER — Encounter: Payer: Self-pay | Admitting: Obstetrics and Gynecology

## 2019-03-11 NOTE — Telephone Encounter (Signed)
Routing MyChart message to Dr. Silva.  

## 2019-03-11 NOTE — Telephone Encounter (Signed)
Patient sent the following message through [place]. Routing to triage to assist patient with request.  [place], [facility]  Phone Number: [phone]  [facility] Dr. Quincy Simmonds,   I do not have a question. Rather, I wish to update you regarding my spotting/flow issue:  1) Following my office visit with you on 2/11, I had light to moderate flow 2/11-2/18. From 2/19 through today, I have had no spotting/flow. Isn't the human body fascinating? :)  2) I will have my COVID test Thursday, 2/25, quarantine for 5 days and then see you on 3/1 for my procedure.   Know that I appreciate you and I hope you are well.   Natasha Simmons

## 2019-03-12 ENCOUNTER — Encounter (HOSPITAL_BASED_OUTPATIENT_CLINIC_OR_DEPARTMENT_OTHER): Payer: Self-pay | Admitting: Obstetrics and Gynecology

## 2019-03-12 ENCOUNTER — Other Ambulatory Visit: Payer: Self-pay

## 2019-03-12 NOTE — Telephone Encounter (Signed)
Thank you for the update! Will proceed with surgery as scheduled.

## 2019-03-12 NOTE — Telephone Encounter (Signed)
Spoke with patient, advised per Dr. Silva. Patient verbalizes understanding and is agreeable.  Encounter closed.  

## 2019-03-12 NOTE — Progress Notes (Signed)
Spoke w/ via phone for pre-op interview---Natasha Simmons needs dos----  Cbc, bmet             COVID test ------03-13-2019 Arrive at -------730 am 03-17-2019 NPO after ------midnight Medications to take morning of surgery -----none Diabetic medication -----n/a Patient Special Instructions ----- Pre-Op special Istructions ----- Patient verbalized understanding of instructions that were given at this phone interview. Patient denies shortness of breath, chest pain, fever, cough a this phone interview.

## 2019-03-13 ENCOUNTER — Other Ambulatory Visit (HOSPITAL_COMMUNITY): Payer: Self-pay

## 2019-03-13 ENCOUNTER — Other Ambulatory Visit (HOSPITAL_COMMUNITY)
Admission: RE | Admit: 2019-03-13 | Discharge: 2019-03-13 | Disposition: A | Discharge status: Home / Self Care | Payer: BC Managed Care – PPO | Source: Ambulatory Visit | Attending: Obstetrics and Gynecology | Admitting: Obstetrics and Gynecology

## 2019-03-13 DIAGNOSIS — Z01812 Encounter for preprocedural laboratory examination: Secondary | ICD-10-CM | POA: Diagnosis not present

## 2019-03-13 DIAGNOSIS — Z20822 Contact with and (suspected) exposure to covid-19: Secondary | ICD-10-CM | POA: Diagnosis not present

## 2019-03-13 LAB — SARS CORONAVIRUS 2 (TAT 6-24 HRS): SARS Coronavirus 2: NEGATIVE

## 2019-03-16 NOTE — H&P (Signed)
Office Visit  02/27/2019 Natasha Simmons, Natasha Simmons All, MD Obstetrics and Gynecology  Postmenopausal bleeding +1 more Dx  Sonohysterogram & EMB; Referred by Chesley Noon, MD Reason for Visit  Additional Documentation  Vitals:    BP 120/62  Pulse 70  Temp 97.4 F (36.3 C)  (Temporal)  Ht 5\' 5"  (1.651 m)  Wt 63.5 kg  LMP 01/16/2013  BMI 23.30 kg/m  BSA 1.71 m    More Vitals  Flowsheets:    Anthropometrics,  NEWS,  MEWS Score,  Method of Visit    Encounter Info:   Billing Info,  History,  Allergies,  Detailed Report    All Notes   Progress Notes by Nunzio Cobbs, MD at 02/27/2019 8:30 AM Author: Nunzio Cobbs, MD Author Type: Physician Filed: 02/27/2019  9:36 AM  Note Status: Signed Cosign: Cosign Not Required Encounter Date: 02/27/2019  Editor: Nunzio Cobbs, MD (Physician)  Prior Versions: 1. Nunzio Cobbs, MD (Physician) at 02/27/2019  9:30 AM - Sign when Signing Visit   2. Orion Crook, RDMS (Technician) at 02/27/2019  9:05 AM - Sign when Signing Visit    Encounter reviewed by Dr. Aundria Rud.              Progress Notes by Nunzio Cobbs, MD at 02/27/2019 8:30 AM Author: Nunzio Cobbs, MD Author Type: Physician Filed: 02/27/2019  9:36 AM  Note Status: Signed Cosign: Cosign Not Required Encounter Date: 02/27/2019  Editor: Nunzio Cobbs, MD (Physician)  Prior Versions: 1. Lowella Fairy, CMA (Certified Medical Assistant) at 02/27/2019  9:05 AM - Sign when Signing Visit   2. Nunzio Cobbs, MD (Physician) at 02/27/2019  8:59 AM - Sign when Signing Visit   3. Lowella Fairy, CMA (Certified Medical Assistant) at 02/27/2019  8:30 AM - Sign when Signing Visit    GYNECOLOGY  VISIT   HPI: 64 y.o.   Divorced  Caucasian  female   [place] with Patient's last menstrual period was 01/16/2013.   here for  sonohysterogram and EMB.    Patient is here for evaluation of postmenopausal bleeding on HRT.    The following is taken from her visit on 02/25/19: In beginning of December, she had some spotting.  She was not sure if it was vaginal or urine as she was having some urinary urgency.  She went to her PCP office and received dx of UTI and Rx for abx and yeast infection.    Now she is having bleeding again, but worse.  Bleeding was more red and occurring for 4 days.  Notices clotting.  No pain or cramping.  She notes the bleeding following intercourse.  In retrospect, she notes that she had bleeding in December following intercourse.    She is on HRT.  No missed doses.    Elderly mother is living at her home until the beginning of April.   GYNECOLOGIC HISTORY: Patient's last menstrual period was 01/16/2013. Contraception:  Postmenopausal Menopausal hormone therapy: Climara patch and Progesterone 100mg  Last mammogram: 09-13-18 3D Neg/density C/Birads1 Last pap smear:02-25-19 pend, 09-10-15 Neg:Neg HR HPV, 08-05-12 Neg:Neg HR HPV                 OB History     Gravida  0   Para  0   Term  0   Preterm  0   AB  0   Living  0      SAB  0   TAB  0   Ectopic  0   Multiple  0   Live Births                        Patient Active Problem List    Diagnosis Date Noted  . Chronic left hip pain 03/31/2016  . 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 Hamstring tendonitis at origin 11/06/2014  . Ischial bursitis of left side 11/06/2014  . Lumbar radiculopathy 11/06/2014  . Neck pain 07/31/2013  . Post menopausal syndrome 07/31/2013          Past Medical History:  Diagnosis Date  . Fibroid             Past Surgical History:  Procedure Laterality Date  . DILATATION & CURRETTAGE/HYSTEROSCOPY WITH RESECTOCOPE N/A 09/11/2012    Procedure: DILATATION & CURETTAGE/HYSTEROSCOPY WITH RESECTOCOPE;  Surgeon: Arloa Koh, MD;  Location: [facility];  Service: Gynecology;  Laterality: N/A;  . knee  surgery   1976; 2005,2004,2003    6 knee surgeries total - all right knee  . WISDOM TOOTH EXTRACTION                Current Outpatient Medications  Medication Sig Dispense Refill  . cholecalciferol (VITAMIN D) 1000 UNITS tablet Take 1,000 Units by mouth daily.      Marland Kitchen EPINEPHrine 0.3 mg/0.3 mL IJ SOAJ injection as needed.      Marland Kitchen estradiol (CLIMARA - DOSED IN MG/24 HR) 0.0375 mg/24hr patch Place on skin once weekly. 24 patch 3  . Multiple Vitamin (MULTI VITAMIN DAILY PO) Take by mouth.      . Omega-3 Fatty Acids (FISH OIL PO) Take by mouth daily.       . progesterone (PROMETRIUM) 100 MG capsule Take 1 capsule (100 mg total) by mouth daily. 90 capsule 3  . tretinoin (RETIN-A) 0.025 % cream Apply 1 application topically at bedtime.        No current facility-administered medications for this visit.      ALLERGIES: Sodium hyaluronate, Cross-linked hyaluronate, Other, and Shrimp [shellfish allergy]        Family History  Problem Relation Age of Onset  . Thyroid disease Mother    . Hypertension Mother    . Diabetes Father    . Heart disease Father    . Liver disease Father    . Lymphoma Father        Social History         Socioeconomic History  . Marital status: Divorced      Spouse name: Not on file  . Number of children: Not on file  . Years of education: Not on file  . Highest education level: Not on file  Occupational History  . Not on file  Tobacco Use  . Smoking status: Never Smoker  . Smokeless tobacco: Never Used  Substance and Sexual Activity  . Alcohol use: No      Comment: occ glass of wine  . Drug use: No  . Sexual activity: Yes      Partners: Male      Birth control/protection: Post-menopausal  Other Topics Concern  . Not on file  Social History Narrative  . Not on file    Social Determinants of Health       Financial Resource Strain:   . Difficulty of Paying Living Expenses: Not on  file  Food Insecurity:   . Worried About Charity fundraiser in  the Last Year: Not on file  . Ran Out of Food in the Last Year: Not on file  Transportation Needs:   . Lack of Transportation (Medical): Not on file  . Lack of Transportation (Non-Medical): Not on file  Physical Activity:   . Days of Exercise per Week: Not on file  . Minutes of Exercise per Session: Not on file  Stress:   . Feeling of Stress : Not on file  Social Connections:   . Frequency of Communication with Friends and Family: Not on file  . Frequency of Social Gatherings with Friends and Family: Not on file  . Attends Religious Services: Not on file  . Active Member of Clubs or Organizations: Not on file  . Attends Archivist Meetings: Not on file  . Marital Status: Not on file  Intimate Partner Violence:   . Fear of Current or Ex-Partner: Not on file  . Emotionally Abused: Not on file  . Physically Abused: Not on file  . Sexually Abused: Not on file      Review of Systems  All other systems reviewed and are negative.     PHYSICAL EXAMINATION:     BP 120/62   Pulse 70   Temp (!) 97.4 F (36.3 C) (Temporal)   Ht 5\' 5"  (1.651 m)   Wt 140 lb (63.5 kg)   LMP 01/16/2013   BMI 23.30 kg/m     General appearance: alert, cooperative and appears stated age   Pelvic US  Uterus with 2 small  Fibroids.  Largest 12 mm.  EMS 6.01 mm. 2 echogenic masses - 15 x 13 mm and 9 x 7 mm (likely fibroid). Ovaries normal.  Left paraovarian versus paratubal cyst 13 x 6 mm, echofree. Free fluid -  no   Sonohysterogram/EMB Consent for procedures.  Sterile prep with Hibiclens.  Os finder used to dilate the cervix.  Cannula placed and sterile saline injected.  2 filling defects noted:  13 mm and 7 mm. Cervix reprepped with Hibiclens.  Pipelle introduced to 8 cm x 2.  Tissue to pathology.  No complications.  Minimal EBL.   Chaperone was present for exam.   ASSESSMENT   Postmenopausal bleeding.  Endometrial masses - suspected polyp and fibroid. HRT.    PLAN   FU  EMB.  Instructions and precautions given.  Discussion of postmenopausal bleeding, polyps, fibroids. Discussion of hysteroscopy with Myosure polypectomy/myomectomy, dilation and curettage.  Risks, benefits, and alternatives reviewed. Risks include but are not limited to bleeding, infection, damage to surrounding organs including uterine perforation requiring hospitalization and laparoscopy, pulmonary edema, reaction to anesthesia, DVT, PE, death, need for further treatment and surgery hysterectomy or medical therapy.   Surgical expectations and recovery discussed.  ACOG HO on hysteroscopy and dilation and curettage.   An After Visit Summary was printed and given to the patient.   _25____ minutes face to face time of which over 50% was spent in counseling.

## 2019-03-17 ENCOUNTER — Encounter (HOSPITAL_BASED_OUTPATIENT_CLINIC_OR_DEPARTMENT_OTHER)
Admission: RE | Disposition: A | Discharge status: Home / Self Care | Payer: Self-pay | Source: Home / Self Care | Attending: Obstetrics and Gynecology

## 2019-03-17 ENCOUNTER — Other Ambulatory Visit: Payer: Self-pay

## 2019-03-17 ENCOUNTER — Ambulatory Visit (HOSPITAL_BASED_OUTPATIENT_CLINIC_OR_DEPARTMENT_OTHER): Payer: BC Managed Care – PPO | Admitting: Anesthesiology

## 2019-03-17 ENCOUNTER — Encounter (HOSPITAL_BASED_OUTPATIENT_CLINIC_OR_DEPARTMENT_OTHER): Payer: Self-pay | Admitting: Obstetrics and Gynecology

## 2019-03-17 ENCOUNTER — Ambulatory Visit (HOSPITAL_BASED_OUTPATIENT_CLINIC_OR_DEPARTMENT_OTHER)
Admission: RE | Admit: 2019-03-17 | Discharge: 2019-03-17 | Disposition: A | Discharge status: Home / Self Care | Payer: BC Managed Care – PPO | Attending: Obstetrics and Gynecology | Admitting: Obstetrics and Gynecology

## 2019-03-17 DIAGNOSIS — G709 Myoneural disorder, unspecified: Secondary | ICD-10-CM | POA: Diagnosis not present

## 2019-03-17 DIAGNOSIS — Z79890 Hormone replacement therapy (postmenopausal): Secondary | ICD-10-CM | POA: Diagnosis not present

## 2019-03-17 DIAGNOSIS — N950 Postmenopausal bleeding: Secondary | ICD-10-CM | POA: Insufficient documentation

## 2019-03-17 DIAGNOSIS — M25552 Pain in left hip: Secondary | ICD-10-CM | POA: Diagnosis not present

## 2019-03-17 DIAGNOSIS — N840 Polyp of corpus uteri: Secondary | ICD-10-CM | POA: Diagnosis not present

## 2019-03-17 DIAGNOSIS — Z79899 Other long term (current) drug therapy: Secondary | ICD-10-CM | POA: Insufficient documentation

## 2019-03-17 DIAGNOSIS — D250 Submucous leiomyoma of uterus: Secondary | ICD-10-CM | POA: Diagnosis not present

## 2019-03-17 DIAGNOSIS — G8929 Other chronic pain: Secondary | ICD-10-CM | POA: Insufficient documentation

## 2019-03-17 HISTORY — PX: DILATATION & CURETTAGE/HYSTEROSCOPY WITH MYOSURE: SHX6511

## 2019-03-17 HISTORY — DX: Postmenopausal bleeding: N95.0

## 2019-03-17 LAB — BASIC METABOLIC PANEL
Anion gap: 8 (ref 5–15)
BUN: 18 mg/dL (ref 8–23)
CO2: 23 mmol/L (ref 22–32)
Calcium: 8.8 mg/dL — ABNORMAL LOW (ref 8.9–10.3)
Chloride: 108 mmol/L (ref 98–111)
Creatinine, Ser: 0.75 mg/dL (ref 0.44–1.00)
GFR calc Af Amer: >60 mL/min (ref >60)
GFR calc non Af Amer: >60 mL/min (ref >60)
Glucose, Bld: 88 mg/dL (ref 70–99)
Potassium: 3.9 mmol/L (ref 3.5–5.1)
Sodium: 139 mmol/L (ref 135–145)

## 2019-03-17 LAB — CBC
HCT: 41.2 % (ref 36.0–46.0)
Hemoglobin: 13.7 g/dL (ref 12.0–15.0)
MCH: 31.6 pg (ref 26.0–34.0)
MCHC: 33.3 g/dL (ref 30.0–36.0)
MCV: 94.9 fL (ref 80.0–100.0)
Platelets: 244 10*3/uL (ref 150–400)
RBC: 4.34 MIL/uL (ref 3.87–5.11)
RDW: 11.9 % (ref 11.5–15.5)
WBC: 4.9 10*3/uL (ref 4.0–10.5)
nRBC: 0 % (ref 0.0–0.2)

## 2019-03-17 SURGERY — DILATATION & CURETTAGE/HYSTEROSCOPY WITH MYOSURE
Anesthesia: General | Site: "Vagina "

## 2019-03-17 MED ORDER — KETOROLAC TROMETHAMINE 15 MG/ML IJ SOLN
15.0000 mg | Freq: Once | INTRAMUSCULAR | Status: DC
  Filled 2019-03-17: qty 1

## 2019-03-17 MED ORDER — FENTANYL CITRATE (PF) 100 MCG/2ML IJ SOLN
INTRAMUSCULAR | Status: DC | PRN
  Administered 2019-03-17: 50 ug via INTRAVENOUS

## 2019-03-17 MED ORDER — KETOROLAC TROMETHAMINE 30 MG/ML IJ SOLN
INTRAMUSCULAR | Status: DC | PRN
  Administered 2019-03-17: 30 mg via INTRAVENOUS

## 2019-03-17 MED ORDER — LACTATED RINGERS IV SOLN
INTRAVENOUS | Status: DC
  Filled 2019-03-17: qty 1000

## 2019-03-17 MED ORDER — LIDOCAINE 2% (20 MG/ML) 5 ML SYRINGE
INTRAMUSCULAR | Status: AC
  Filled 2019-03-17: qty 5

## 2019-03-17 MED ORDER — DEXAMETHASONE SODIUM PHOSPHATE 10 MG/ML IJ SOLN
INTRAMUSCULAR | Status: AC
  Filled 2019-03-17: qty 1

## 2019-03-17 MED ORDER — MEPERIDINE HCL 25 MG/ML IJ SOLN
6.2500 mg | INTRAMUSCULAR | Status: DC | PRN
  Filled 2019-03-17: qty 1

## 2019-03-17 MED ORDER — LACTATED RINGERS IV SOLN
INTRAVENOUS | Status: DC
  Administered 2019-03-17: 125 mL/h via INTRAVENOUS
  Filled 2019-03-17: qty 1000

## 2019-03-17 MED ORDER — KETOROLAC TROMETHAMINE 30 MG/ML IJ SOLN
INTRAMUSCULAR | Status: AC
  Filled 2019-03-17: qty 1

## 2019-03-17 MED ORDER — LIDOCAINE 2% (20 MG/ML) 5 ML SYRINGE
INTRAMUSCULAR | Status: DC | PRN
  Administered 2019-03-17: 100 mg via INTRAVENOUS

## 2019-03-17 MED ORDER — PROPOFOL 10 MG/ML IV BOLUS
INTRAVENOUS | Status: DC | PRN
  Administered 2019-03-17: 150 mg via INTRAVENOUS

## 2019-03-17 MED ORDER — ONDANSETRON HCL 4 MG/2ML IJ SOLN
INTRAMUSCULAR | Status: DC | PRN
  Administered 2019-03-17: 4 mg via INTRAVENOUS

## 2019-03-17 MED ORDER — HYDROMORPHONE HCL 1 MG/ML IJ SOLN
0.2500 mg | INTRAMUSCULAR | Status: DC | PRN
  Filled 2019-03-17: qty 0.5

## 2019-03-17 MED ORDER — DEXAMETHASONE SODIUM PHOSPHATE 10 MG/ML IJ SOLN
INTRAMUSCULAR | Status: DC | PRN
  Administered 2019-03-17: 5 mg via INTRAVENOUS

## 2019-03-17 MED ORDER — LIDOCAINE HCL 1 % IJ SOLN
INTRAMUSCULAR | Status: DC | PRN
  Administered 2019-03-17: 10 mL

## 2019-03-17 MED ORDER — IBUPROFEN 800 MG PO TABS: 800.0000 mg | ORAL_TABLET | Freq: Three times a day (TID) | ORAL | 0 refills | Status: DC | PRN

## 2019-03-17 MED ORDER — ONDANSETRON HCL 4 MG/2ML IJ SOLN
4.0000 mg | Freq: Once | INTRAMUSCULAR | Status: DC | PRN
  Filled 2019-03-17: qty 2

## 2019-03-17 MED ORDER — ACETAMINOPHEN 500 MG PO TABS
ORAL_TABLET | ORAL | Status: AC
  Filled 2019-03-17: qty 2

## 2019-03-17 MED ORDER — FENTANYL CITRATE (PF) 100 MCG/2ML IJ SOLN
INTRAMUSCULAR | Status: AC
  Filled 2019-03-17: qty 2

## 2019-03-17 MED ORDER — SODIUM CHLORIDE 0.9 % IR SOLN
Status: DC | PRN
  Administered 2019-03-17: 3000 mL

## 2019-03-17 MED ORDER — ONDANSETRON HCL 4 MG/2ML IJ SOLN
INTRAMUSCULAR | Status: AC
  Filled 2019-03-17: qty 2

## 2019-03-17 MED ORDER — MIDAZOLAM HCL 2 MG/2ML IJ SOLN
INTRAMUSCULAR | Status: AC
  Filled 2019-03-17: qty 2

## 2019-03-17 MED ORDER — MIDAZOLAM HCL 2 MG/2ML IJ SOLN
INTRAMUSCULAR | Status: DC | PRN
  Administered 2019-03-17: 1 mg via INTRAVENOUS

## 2019-03-17 MED ORDER — PROPOFOL 10 MG/ML IV BOLUS
INTRAVENOUS | Status: AC
  Filled 2019-03-17: qty 20

## 2019-03-17 SURGICAL SUPPLY — 19 items
CANISTER SUCT 3000ML PPV (MISCELLANEOUS) ×4 IMPLANT
CATH ROBINSON RED A/P 16FR (CATHETERS) ×4 IMPLANT
COVER WAND RF STERILE (DRAPES) ×4 IMPLANT
DEVICE MYOSURE LITE (MISCELLANEOUS) ×3 IMPLANT
DEVICE MYOSURE REACH (MISCELLANEOUS) IMPLANT
DILATOR CANAL MILEX (MISCELLANEOUS) IMPLANT
GAUZE 4X4 16PLY RFD (DISPOSABLE) ×4 IMPLANT
GLOVE BIO SURGEON STRL SZ 6.5 (GLOVE) ×3 IMPLANT
GLOVE BIO SURGEONS STRL SZ 6.5 (GLOVE) ×1
GOWN STRL REUS W/TWL LRG LVL3 (GOWN DISPOSABLE) ×4 IMPLANT
IV NS IRRIG 3000ML ARTHROMATIC (IV SOLUTION) ×4 IMPLANT
KIT PROCEDURE FLUENT (KITS) ×4 IMPLANT
MYOSURE XL FIBROID (MISCELLANEOUS)
PACK VAGINAL MINOR WOMEN LF (CUSTOM PROCEDURE TRAY) ×4 IMPLANT
PAD OB MATERNITY 4.3X12.25 (PERSONAL CARE ITEMS) ×4 IMPLANT
SEAL CERVICAL OMNI LOK (ABLATOR) IMPLANT
SEAL ROD LENS SCOPE MYOSURE (ABLATOR) ×4 IMPLANT
SYSTEM TISS REMOVAL MYOSURE XL (MISCELLANEOUS) IMPLANT
TOWEL OR 17X26 10 PK STRL BLUE (TOWEL DISPOSABLE) ×8 IMPLANT

## 2019-03-17 NOTE — Transfer of Care (Signed)
Immediate Anesthesia Transfer of Care Note  Patient: Natasha Simmons  Procedure(s) Performed: DILATATION & CURETTAGE/HYSTEROSCOPY WITH MYOSURE REMOVAL OF ENDOMETRIAL POLYP AND FIBROID/KS (N/A Vagina )  Patient Location: PACU  Anesthesia Type:General  Level of Consciousness: awake, alert , oriented and patient cooperative  Airway & Oxygen Therapy: Patient Spontanous Breathing and Patient connected to nasal cannula oxygen  Post-op Assessment: Report given to RN and Post -op Vital signs reviewed and stable  Post vital signs: Reviewed and stable  Last Vitals:  Vitals Value Taken Time  BP 121/69 03/17/19 0937  Temp 36.4 C 03/17/19 0937  Pulse 70 03/17/19 0942  Resp 13 03/17/19 0942  SpO2 100 % 03/17/19 0942  Vitals shown include unvalidated device data.  Last Pain:  Vitals:   03/17/19 0937  TempSrc:   PainSc: 0-No pain      Patients Stated Pain Goal: 4 ([phone]  Complications: No apparent anesthesia complications

## 2019-03-17 NOTE — Discharge Instructions (Signed)
Hysteroscopy, Care After This sheet gives you information about how to care for yourself after your procedure. Your health care provider may also give you more specific instructions. If you have problems or questions, contact your health care provider. What can I expect after the procedure? After the procedure, it is common to have:  Cramping.  Bleeding. This can vary from light spotting to menstrual-like bleeding. Follow these instructions at home: Activity  Rest for 1-2 days after the procedure.  Do not douche, use tampons, or have sex for 2 weeks after the procedure, or until your health care provider approves.  Do not drive for 24 hours after the procedure, or for as long as told by your health care provider.  Do not drive, use heavy machinery, or drink alcohol while taking prescription pain medicines. Medicines   Take over-the-counter and prescription medicines only as told by your health care provider.  Do not take aspirin during recovery. It can increase the risk of bleeding. General instructions  Do not take baths, swim, or use a hot tub until your health care provider approves. Take showers instead of baths for 2 weeks, or for as long as told by your health care provider.  To prevent or treat constipation while you are taking prescription pain medicine, your health care provider may recommend that you: ? Drink enough fluid to keep your urine clear or pale yellow. ? Take over-the-counter or prescription medicines. ? Eat foods that are high in fiber, such as fresh fruits and vegetables, whole grains, and beans. ? Limit foods that are high in fat and processed sugars, such as fried and sweet foods.  Keep all follow-up visits as told by your health care provider. This is important. Contact a health care provider if:  You feel dizzy or lightheaded.  You feel nauseous.  You have abnormal vaginal discharge.  You have a rash.  You have pain that does not get better with  medicine.  You have chills. Get help right away if:  You have bleeding that is heavier than a normal menstrual period.  You have a fever.  You have pain or cramps that get worse.  You develop new abdominal pain.  You faint.  You have pain in your shoulders.  You have shortness of breath. Summary  After the procedure, you may have cramping and some vaginal bleeding.  Do not douche, use tampons, or have sex for 2 weeks after the procedure, or until your health care provider approves.  Do not take baths, swim, or use a hot tub until your health care provider approves. Take showers instead of baths for 2 weeks, or for as long as told by your health care provider.  Report any unusual symptoms to your health care provider.  Keep all follow-up visits as told by your health care provider. This is important. This information is not intended to replace advice given to you by your health care provider. Make sure you discuss any questions you have with your health care provider. Document Revised: 12/15/2016 Document Reviewed: 02/01/2016 Elsevier Patient Education  2020 [employer].  Call your surgeon if you experience:   1.  Fever over 101.0. 2.  Inability to urinate. 3.  Nausea and/or vomiting. 4.  Extreme swelling or bruising at the surgical site. 5.  Continued bleeding from the incision. 6.  Increased pain, redness or drainage from the incision. 7.  Problems related to your pain medication. 8.  Any problems and/or concernsCall your surgeon if you experience:  1.  Fever over 101.0. 2.  Inability to urinate. 3.  Nausea and/or vomiting. 4.  Extreme swelling or bruising at the surgical site. 5.  Continued bleeding from the incision. 6.  Increased pain, redness or drainage from the incision. 7.  Problems related to your pain medication. 8.  Any problems and/or concernsCall your surgeon if you experience:   1.  Fever over 101.0. 2.  Inability to urinate. 3.  Nausea and/or  vomiting. 4.  Extreme swelling or bruising at the surgical site. 5.  Continued bleeding from the incision. 6.  Increased pain, redness or drainage from the incision. 7.  Problems related to your pain medication. 8.  Any problems and/or concerns.   Post Anesthesia Home Care Instructions  Activity: Get plenty of rest for the remainder of the day. A responsible individual must stay with you for 24 hours following the procedure.  For the next 24 hours, DO NOT: -Drive a car -Paediatric nurse -Drink alcoholic beverages -Take any medication unless instructed by your physician -Make any legal decisions or sign important papers.  Meals: Start with liquid foods such as gelatin or soup. Progress to regular foods as tolerated. Avoid greasy, spicy, heavy foods. If nausea and/or vomiting occur, drink only clear liquids until the nausea and/or vomiting subsides. Call your physician if vomiting continues.  Special Instructions/Symptoms: Your throat may feel dry or sore from the anesthesia or the breathing tube placed in your throat during surgery. If this causes discomfort, gargle with warm salt water. The discomfort should disappear within 24 hours.

## 2019-03-17 NOTE — Anesthesia Preprocedure Evaluation (Addendum)
Anesthesia Evaluation  Patient identified by MRN, date of birth, ID band Patient awake    Reviewed: Allergy & Precautions, NPO status , Patient's Chart, lab work & pertinent test results  Airway Mallampati: I       Dental no notable dental hx. (+) Teeth Intact, Dental Advisory Given,    Pulmonary neg pulmonary ROS,    Pulmonary exam normal breath sounds clear to auscultation       Cardiovascular negative cardio ROS Normal cardiovascular exam Rhythm:Regular Rate:Normal     Neuro/Psych  Neuromuscular disease negative psych ROS   GI/Hepatic negative GI ROS, Neg liver ROS,   Endo/Other  negative endocrine ROS  Renal/GU negative Renal ROS  negative genitourinary   Musculoskeletal   Abdominal Normal abdominal exam  (+)   Peds  Hematology negative hematology ROS (+)   Anesthesia Other Findings   Reproductive/Obstetrics                            Anesthesia Physical Anesthesia Plan  ASA: II  Anesthesia Plan: General   Post-op Pain Management:    Induction: Intravenous  PONV Risk Score and Plan: 4 or greater and Ondansetron and Dexamethasone  Airway Management Planned: LMA  Additional Equipment: None  Intra-op Plan:   Post-operative Plan: Extubation in OR  Informed Consent: I have reviewed the patients History and Physical, chart, labs and discussed the procedure including the risks, benefits and alternatives for the proposed anesthesia with the patient or authorized representative who has indicated his/her understanding and acceptance.     Dental advisory given  Plan Discussed with: CRNA  Anesthesia Plan Comments:         Anesthesia Quick Evaluation

## 2019-03-17 NOTE — Op Note (Signed)
OPERATIVE REPORT   PREOPERATIVE DIAGNOSES:   Postmenopausal bleeding, endometrial polyp, submucous fibroid  POSTOPERATIVE DIAGNOSES:   Postmenopausal bleeding, endometrial polyp, submucous fibroid  PROCEDURE:  Hysteroscopy with dilation and curettage and Myosure resection of endometrial polyp and fibroid  SURGEON:  Lenard Galloway, MD  ANESTHESIA:  LMA, paracervical block with 10 mL of 1% lidocaine.  IV FLUIDS:   500 cc LR  EBL:  5 cc  URINE OUTPUT:   75 cc   NORMAL SALINE DEFICIT:   [id number] cc  COMPLICATIONS:  None.  INDICATIONS FOR THE PROCEDURE:    The patient is a 64 year old G58 Caucasian female who presents with postmenopausal bleeding on  hormone replacement therapy.  Sonohysterogram showed 2 filling defects, one thought to be consistent with a polyp and one consistent with a small submucous fibroid.  Her endometrial biopsy demonstrated a benign endometrial polyp.   A plan is now made to proceed with a hysteroscopy with dilation and curettage and resection of endometrial polyp and submucous fibroid; after risks, benefits and alternatives were reviewed.  FINDINGS:  Exam under anesthesia revealed a small uterus with no adnexal masses. The uterus was sounded to 8 cm Hysteroscopy showed a right fundal endometrial polyp and small partially submucous fibroid. Endometrial currettings were scant.  SPECIMENS:  The endometrial polyp and fibroid were sent to Pathology together.   The endometrial curettings were sent to Pathology separately from this.  PROCEDURE IN DETAIL:  The patient was reidentified in the preoperative hold area.  She received TED hose and PAS stockings for DVT prophylaxis.  In the operating room, the patient was placed in the dorsal lithotomy position and then an LMA anesthetic was introduced.  The patient's lower abdomen, vagina and perineum were sterilely prepped with Hibiclens and the  patient's bladder was catheterized of urine.  She was sterilly draped  An  exam under anesthesia was performed.  A speculum was placed inside the vagina and a single-tooth tenaculum was placed on the anterior cervical lip.  A paracervical block was performed with a total of 10 mL of 1% lidocaine plain.  The uterus was sounded. The cervix was dilated to a #19 Pratt dilator.  The MyoSure hysteroscope was then inserted inside the uterine cavity under the continuous infusion of normal saline solution.  Findings are as noted above.  The MyoSure hysteroscope was removed after the polyp and submucous fibroid were resected. This specimen was sent to Pathology.  The cervix was further dilated to  a #21 Pratt dilator.  The serrated curette was introduced into the uterine cavity and  the endometrium was curetted in all 4 quadrants.  A minimal amount of endometrial  curettings was obtained.  This specimen was sent to Pathology.  The single-tooth tenaculum which had been placed on the anterior cervical lip was removed.   Silver nitrate was used to create hemostasis at the tenaculum site.  All of the vaginal  instruments were removed.  The patient was awakened and escorted to the recovery room in stable condition.  There  were no complications to the procedure.  All needle, instrument and sponge counts were  correct.  Lenard Galloway, MD

## 2019-03-17 NOTE — Progress Notes (Signed)
Update to History and Physical  No marked change in status since office pre-op visit.   Patient examined.   OK to proceed with surgery. 

## 2019-03-17 NOTE — Anesthesia Procedure Notes (Signed)
Procedure Name: LMA Insertion Date/Time: 03/17/2019 9:00 AM Performed by: Wanita Chamberlain, CRNA Pre-anesthesia Checklist: Patient identified, Timeout performed, Emergency Drugs available and Suction available Patient Re-evaluated:Patient Re-evaluated prior to induction Oxygen Delivery Method: Circle system utilized Preoxygenation: Pre-oxygenation with 100% oxygen Induction Type: IV induction Ventilation: Mask ventilation without difficulty LMA: LMA inserted LMA Size: 4.0 Number of attempts: 1 Placement Confirmation: breath sounds checked- equal and bilateral,  CO2 detector and positive ETCO2 Tube secured with: Tape Dental Injury: Teeth and Oropharynx as per pre-operative assessment

## 2019-03-18 LAB — SURGICAL PATHOLOGY

## 2019-03-18 NOTE — Anesthesia Postprocedure Evaluation (Signed)
Anesthesia Post Note  Patient: Natasha Simmons  Procedure(s) Performed: DILATATION & CURETTAGE/HYSTEROSCOPY WITH MYOSURE REMOVAL OF ENDOMETRIAL POLYP AND FIBROID/KS (N/A Vagina )     Patient location during evaluation: PACU Anesthesia Type: General Level of consciousness: awake Pain management: pain level controlled Vital Signs Assessment: post-procedure vital signs reviewed and stable Respiratory status: spontaneous breathing Cardiovascular status: stable Postop Assessment: no apparent nausea or vomiting Anesthetic complications: no    Last Vitals:  Vitals:   03/17/19 1015 03/17/19 1032  BP: 108/62 109/73  Pulse: (!) 55 (!) 56  Resp: 12 12  Temp:  36.8 C  SpO2: 99% 100%    Last Pain:  Vitals:   03/17/19 1032  TempSrc:   PainSc: 0-No pain   Pain Goal: Patients Stated Pain Goal: 4 (03/17/19 0753)                 Huston Foley

## 2019-03-19 ENCOUNTER — Telehealth: Payer: Self-pay | Admitting: Obstetrics and Gynecology

## 2019-03-19 NOTE — Telephone Encounter (Signed)
Patient is returning a call to Stephanie. °

## 2019-03-19 NOTE — Telephone Encounter (Signed)
See lab results on 03/18/2019.  Encounter closed

## 2019-03-31 ENCOUNTER — Other Ambulatory Visit: Payer: Self-pay

## 2019-03-31 ENCOUNTER — Ambulatory Visit (INDEPENDENT_AMBULATORY_CARE_PROVIDER_SITE_OTHER): Payer: BC Managed Care – PPO | Admitting: Obstetrics and Gynecology

## 2019-03-31 ENCOUNTER — Encounter: Payer: Self-pay | Admitting: Obstetrics and Gynecology

## 2019-03-31 VITALS — BP 132/74 | HR 66 | Temp 97.0°F | Ht 65.0 in | Wt 138.4 lb

## 2019-03-31 DIAGNOSIS — Z9889 Other specified postprocedural states: Secondary | ICD-10-CM

## 2019-03-31 MED ORDER — ESTRADIOL 0.025 MG/24HR TD PTTW: 1.0000 | MEDICATED_PATCH | TRANSDERMAL | 1 refills | Status: DC

## 2019-03-31 NOTE — Progress Notes (Signed)
GYNECOLOGY  VISIT   HPI: 64 y.o.   Divorced  Caucasian  female   [place] with Patient's last menstrual period was 01/16/2013.   here for 2 week follow up [place] FIBROID/KS (N/A Vagina ).  Final pathology:  Benign endometrial polyp and benign inactive endometrium.   Took only one Ibuprofen post op.  Bled for less than a week.   Patient doing well overall.   GYNECOLOGIC HISTORY: Patient's last menstrual period was 01/16/2013. Contraception:  PMP Menopausal hormone therapy:  Climara patch and Progesterone 100mg  Last mammogram:  09-13-18 3D Neg/density C/Birads1 Last pap smear::02-26-19 Neg:Neg HR HPV, 09-10-15 Neg:Neg HR HPV, 08-05-12 Neg:Neg HR HPV         OB History    Gravida  0   Para  0   Term  0   Preterm  0   AB  0   Living  0     SAB  0   TAB  0   Ectopic  0   Multiple  0   Live Births                 Patient Active Problem List   Diagnosis Date Noted  . Chronic left hip pain 03/31/2016  . 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 Hamstring tendonitis at origin 11/06/2014  . Ischial bursitis of left side 11/06/2014  . Lumbar radiculopathy 11/06/2014  . Neck pain 07/31/2013  . Post menopausal syndrome 07/31/2013    Past Medical History:  Diagnosis Date  . Fibroid   . Postmenopausal bleeding     Past Surgical History:  Procedure Laterality Date  . DILATATION & CURETTAGE/HYSTEROSCOPY WITH MYOSURE N/A 03/17/2019   Procedure: DILATATION & CURETTAGE/HYSTEROSCOPY WITH MYOSURE REMOVAL OF ENDOMETRIAL POLYP AND FIBROID/KS;  Surgeon: Nunzio Cobbs, MD;  Location: [facility];  Service: Gynecology;  Laterality: N/A;  removal of endometrial polyp and fibroid/ks  . DILATATION & CURRETTAGE/HYSTEROSCOPY WITH RESECTOCOPE N/A 09/11/2012   Procedure: DILATATION & CURETTAGE/HYSTEROSCOPY WITH RESECTOCOPE;  Surgeon: Arloa Koh, MD;  Location: [facility];  Service: Gynecology;   Laterality: N/A;  . knee surgery  1976; 2005,2004,2003   6 knee surgeries total - all right knee  . WISDOM TOOTH EXTRACTION      Current Outpatient Medications  Medication Sig Dispense Refill  . cholecalciferol (VITAMIN D) 1000 UNITS tablet Take 1,000 Units by mouth daily.    Marland Kitchen estradiol (CLIMARA - DOSED IN MG/24 HR) 0.0375 mg/24hr patch Place on skin once weekly. 24 patch 3  . Multiple Vitamin (MULTI VITAMIN DAILY PO) Take by mouth.    . Omega-3 Fatty Acids (FISH OIL PO) Take by mouth daily.     . progesterone (PROMETRIUM) 100 MG capsule Take 1 capsule (100 mg total) by mouth daily. (Patient taking differently: Take 100 mg by mouth at bedtime. ) 90 capsule 3  . tretinoin (RETIN-A) 0.025 % cream Apply 1 application topically at bedtime.     No current facility-administered medications for this visit.     ALLERGIES: Sodium hyaluronate, Cross-linked hyaluronate, Other, and Shrimp [shellfish allergy]  Family History  Problem Relation Age of Onset  . Thyroid disease Mother   . Hypertension Mother   . Diabetes Father   . Heart disease Father   . Liver disease Father   . Lymphoma Father     Social History   Socioeconomic History  . Marital status: Divorced    Spouse name: Not on file  .  Number of children: Not on file  . Years of education: Not on file  . Highest education level: Not on file  Occupational History  . Not on file  Tobacco Use  . Smoking status: Never Smoker  . Smokeless tobacco: Never Used  Substance and Sexual Activity  . Alcohol use: Yes    Comment: occ glass of wine  . Drug use: No  . Sexual activity: Yes    Partners: Male    Birth control/protection: Post-menopausal  Other Topics Concern  . Not on file  Social History Narrative  . Not on file   Social Determinants of Health   Financial Resource Strain:   . Difficulty of Paying Living Expenses:   Food Insecurity:   . Worried About Charity fundraiser in the Last Year:   . Arboriculturist in the  Last Year:   Transportation Needs:   . Film/video editor (Medical):   Marland Kitchen Lack of Transportation (Non-Medical):   Physical Activity:   . Days of Exercise per Week:   . Minutes of Exercise per Session:   Stress:   . Feeling of Stress :   Social Connections:   . Frequency of Communication with Friends and Family:   . Frequency of Social Gatherings with Friends and Family:   . Attends Religious Services:   . Active Member of Clubs or Organizations:   . Attends Archivist Meetings:   Marland Kitchen Marital Status:   Intimate Partner Violence:   . Fear of Current or Ex-Partner:   . Emotionally Abused:   Marland Kitchen Physically Abused:   . Sexually Abused:     Review of Systems  All other systems reviewed and are negative.   PHYSICAL EXAMINATION:    BP 132/74   Pulse 66   Temp (!) 97 F (36.1 C) (Temporal)   Ht 5\' 5"  (1.651 m)   Wt 138 lb 6.4 oz (62.8 kg)   LMP 01/16/2013   BMI 23.03 kg/m     General appearance: alert, cooperative and appears stated age   Pelvic: External genitalia:  no lesions              Urethra:  normal appearing urethra with no masses, tenderness or lesions              Bartholins and Skenes: normal                 Vagina: normal appearing vagina with normal color and discharge, no lesions              Cervix: no lesions.  Tenaculum mark noted on cervix.                 Bimanual Exam:  Uterus:  normal size, contour, position, consistency, mobility, non-tender              Adnexa: no mass, fullness, tenderness             Chaperone was present for exam.  ASSESSMENT  Status post hysteroscopic polypectomy with Myosure, dilation and curettage.  HRT patient.   PLAN  Surgical findings, procedure, and pathology reviewed. Discused WHI and use of HRT which can increase risk of PE, DVT, MI, stroke and breast cancer.  We decided together to lower her transdermal estrogen to 0.025 mg twice weekly.  Continue Prometrium 100 mg nightly.  FU for annual exam and prn.    An After Visit Summary was printed and given to the patient.

## 2019-05-02 ENCOUNTER — Ambulatory Visit: Payer: BC Managed Care – PPO | Attending: Internal Medicine

## 2019-05-02 DIAGNOSIS — Z23 Encounter for immunization: Secondary | ICD-10-CM

## 2019-05-02 NOTE — Progress Notes (Signed)
   Covid-19 Vaccination Clinic  Name:  Natasha Simmons    MRN: [id number] DOB: 02-07-1955  05/02/2019  [name] was observed post Covid-19 immunization for 15 minutes without incident. She was provided with Vaccine Information Sheet and instruction to access the V-Safe system.   [name] was instructed to call 911 with any severe reactions post vaccine: Marland Kitchen Difficulty breathing  . Swelling of face and throat  . A fast heartbeat  . A bad rash all over body  . Dizziness and weakness   Immunizations Administered    Name Date Dose VIS Date Route   Moderna COVID-19 Vaccine 05/02/2019 12:19 PM 0.5 mL 12/17/2018 Intramuscular   Manufacturer: Levan Hurst   Lot: [id number]   [place]: [id number]      Covid-19 Vaccination Clinic  Name:  Natasha Simmons    MRN: [id number] DOB: 11-01-1955  05/02/2019  [name] was observed post Covid-19 immunization for 15 minutes without incident. She was provided with Vaccine Information Sheet and instruction to access the V-Safe system.   [name] was instructed to call 911 with any severe reactions post vaccine: Marland Kitchen Difficulty breathing  . Swelling of face and throat  . A fast heartbeat  . A bad rash all over body  . Dizziness and weakness   Immunizations Administered    Name Date Dose VIS Date Route   Moderna COVID-19 Vaccine 05/02/2019 12:19 PM 0.5 mL 12/17/2018 Intramuscular   Manufacturer: Moderna   Lot: [id number]   [place]:[id number]

## 2019-06-18 ENCOUNTER — Telehealth: Payer: Self-pay | Admitting: Obstetrics and Gynecology

## 2019-06-18 NOTE — Telephone Encounter (Signed)
AEX 09/2019 with Dr Quincy Simmonds H/O hysteroscopic polypectomy with Myosure, dilation and curettage 03/17/2019  Pt returned call.  Spoke with pt. Pt states has questions about when to stop HRT therapy. Pt was thinking had to wait until AEX, but is about to pick up last Rx refill of Vivelle Dot patches on 06/30/19.  Pt states will have 3 patches left of Vivelle Dot as of this Friday 06/20/19 and has one 3 month supply refill left at pharmacy. Pt states has enough Prometrium Rx left at pharmacy until AEX.   Pt's question is, can she stop patch and progesterone  before getting new refill at pharmacy on 06/30/19 or does she need to continue both Rx until AEX?   Advised will review with Dr Quincy Simmonds and return call to pt. Pt agreeable.   Routing to Dr Quincy Simmonds.

## 2019-06-18 NOTE — Telephone Encounter (Signed)
Patient has a questions about her HRT therapy.

## 2019-06-18 NOTE — Telephone Encounter (Signed)
Left message for pt to return call to triage RN. 

## 2019-06-19 NOTE — Telephone Encounter (Signed)
Left message for pt to return call to triage RN. 

## 2019-06-19 NOTE — Telephone Encounter (Signed)
She may stop both her estrogen and progesterone together at any time.  She does not have to continue until her annual exam.

## 2019-06-20 NOTE — Telephone Encounter (Signed)
Patient is returning call.  °

## 2019-06-20 NOTE — Telephone Encounter (Signed)
Spoke with patient. Advised as seen below per Dr. Silva.  Patient verbalizes understanding and is agreeable.   Encounter closed.  

## 2019-09-25 ENCOUNTER — Other Ambulatory Visit: Payer: Self-pay | Admitting: Obstetrics and Gynecology

## 2019-09-25 DIAGNOSIS — Z1231 Encounter for screening mammogram for malignant neoplasm of breast: Secondary | ICD-10-CM

## 2019-10-07 NOTE — Progress Notes (Signed)
64 y.o. G0P0000 Divorced Caucasian female here for annual exam.    Stopped HRT in June, 2021.  Did treatment for about 5 years.  Went to [place] for 2 months this summer. Had a lot of hot flashes and felt very emotional. Trying essential oils. Wants a natural approach.   Denies vaginal bleeding.  No vaginal dryness.   Completed Covid vaccine.   PCP:   Anastasia Pall, MD  Patient's last menstrual period was 01/16/2013.           Sexually active: Yes.    The current method of family planning is post menopausal status.    Exercising: Yes.    walking, elliptical and some weights Smoker:  no  Health Maintenance: Pap: 02-26-19 Neg:Neg HR HPV, 09-10-15 Neg:Neg HR HPV, 08-05-12 Neg:Neg HR HPV  History of abnormal Pap:  no MMG: 09-13-18 3D Neg/density C/Birads1--Appt.10-22-19.   Colonoscopy:  2011 normal;next 10 years -- she will call to schedule BMD: 10-05-17  Result :Normal TDaP: 2017 Gardasil:   no HIV: no Hep C: 12-17-15 Neg with [facility] Screening Labs:  PCP.   Shingrix:  Completed.    reports that she has never smoked. She has never used smokeless tobacco. She reports current alcohol use. She reports that she does not use drugs.  Past Medical History:  Diagnosis Date  . Fibroid   . Postmenopausal bleeding     Past Surgical History:  Procedure Laterality Date  . DILATATION & CURETTAGE/HYSTEROSCOPY WITH MYOSURE N/A 03/17/2019   Procedure: DILATATION & CURETTAGE/HYSTEROSCOPY WITH MYOSURE REMOVAL OF ENDOMETRIAL POLYP AND FIBROID/KS;  Surgeon: Nunzio Cobbs, MD;  Location: [facility];  Service: Gynecology;  Laterality: N/A;  removal of endometrial polyp and fibroid/ks  . DILATATION & CURRETTAGE/HYSTEROSCOPY WITH RESECTOCOPE N/A 09/11/2012   Procedure: DILATATION & CURETTAGE/HYSTEROSCOPY WITH RESECTOCOPE;  Surgeon: Arloa Koh, MD;  Location: [facility];  Service: Gynecology;  Laterality: N/A;  . knee surgery  1976; 2005,2004,2003   6 knee surgeries total - all  right knee  . WISDOM TOOTH EXTRACTION      Current Outpatient Medications  Medication Sig Dispense Refill  . cholecalciferol (VITAMIN D) 1000 UNITS tablet Take 1,000 Units by mouth daily.    . Multiple Vitamin (MULTI VITAMIN DAILY PO) Take by mouth.    . Omega-3 Fatty Acids (FISH OIL PO) Take by mouth daily.     Marland Kitchen tretinoin (RETIN-A) 0.025 % cream Apply 1 application topically at bedtime.     No current facility-administered medications for this visit.    Family History  Problem Relation Age of Onset  . Thyroid disease Mother   . Hypertension Mother   . Diabetes Father   . Heart disease Father   . Liver disease Father   . Lymphoma Father     Review of Systems  All other systems reviewed and are negative.   Exam:   BP 122/68   Pulse 76   Resp 12   Ht 5\' 5"  (1.651 m)   Wt 137 lb (62.1 kg)   LMP 01/16/2013   BMI 22.80 kg/m     General appearance: alert, cooperative and appears stated age Head: normocephalic, without obvious abnormality, atraumatic Neck: no adenopathy, supple, symmetrical, trachea midline and thyroid normal to inspection and palpation Lungs: clear to auscultation bilaterally Breasts: normal appearance, no masses or tenderness, No nipple retraction or dimpling, No nipple discharge or bleeding, No axillary adenopathy Heart: regular rate and rhythm Abdomen: soft, non-tender; no masses, no organomegaly Extremities: extremities  normal, atraumatic, no cyanosis or edema Skin: skin color, texture, turgor normal. No rashes or lesions Lymph nodes: cervical, supraclavicular, and axillary nodes normal. Neurologic: grossly normal  Pelvic: External genitalia:  no lesions              No abnormal inguinal nodes palpated.              Urethra:  normal appearing urethra with no masses, tenderness or lesions              Bartholins and Skenes: normal                 Vagina: normal appearing vagina with normal color and discharge, no lesions              Cervix: no  lesions              Pap taken: No. Bimanual Exam:  Uterus:  normal size, contour, position, consistency, mobility, non-tender              Adnexa: no mass, fullness, tenderness              Rectal exam: Yes.  .  Confirms.              Anus:  normal sphincter tone, no lesions  Chaperone was present for exam.  Assessment:   Well woman visit with normal exam. Fibroids.  Hx endometrial polyps.  Off HRT.  Plan: Mammogram screening discussed. Self breast awareness reviewed. Pap and HR HPV as above. Guidelines for Calcium, Vitamin D, regular exercise program including cardiovascular and weight bearing exercise. Try Estroven with melatonin. She will schedule her colonoscopy.  Labs with PCP.  Follow up annually and prn.   After visit summary provided.

## 2019-10-08 ENCOUNTER — Encounter: Payer: Self-pay | Admitting: Obstetrics and Gynecology

## 2019-10-08 ENCOUNTER — Ambulatory Visit: Payer: BC Managed Care – PPO | Admitting: Obstetrics and Gynecology

## 2019-10-08 ENCOUNTER — Other Ambulatory Visit: Payer: Self-pay

## 2019-10-08 VITALS — BP 122/68 | HR 76 | Resp 12 | Ht 65.0 in | Wt 137.0 lb

## 2019-10-08 DIAGNOSIS — Z01419 Encounter for gynecological examination (general) (routine) without abnormal findings: Secondary | ICD-10-CM | POA: Diagnosis not present

## 2019-10-08 NOTE — Patient Instructions (Signed)

## 2019-10-15 ENCOUNTER — Ambulatory Visit: Payer: BC Managed Care – PPO

## 2019-10-22 ENCOUNTER — Ambulatory Visit
Admission: RE | Admit: 2019-10-22 | Discharge: 2019-10-22 | Disposition: A | Discharge status: Home / Self Care | Payer: BC Managed Care – PPO | Source: Ambulatory Visit | Attending: Obstetrics and Gynecology | Admitting: Obstetrics and Gynecology

## 2019-10-22 ENCOUNTER — Other Ambulatory Visit: Payer: Self-pay

## 2019-10-22 DIAGNOSIS — Z1231 Encounter for screening mammogram for malignant neoplasm of breast: Secondary | ICD-10-CM

## 2020-03-01 ENCOUNTER — Encounter: Payer: Self-pay | Admitting: Gastroenterology

## 2020-10-13 ENCOUNTER — Ambulatory Visit: Payer: BC Managed Care – PPO | Admitting: Obstetrics and Gynecology

## 2020-11-12 ENCOUNTER — Other Ambulatory Visit: Payer: Self-pay | Admitting: Family Medicine

## 2020-11-12 DIAGNOSIS — Z1231 Encounter for screening mammogram for malignant neoplasm of breast: Secondary | ICD-10-CM

## 2020-12-15 NOTE — Progress Notes (Signed)
65 y.o. G0P0000 Divorced Caucasian female here for annual exam.    Hot flashes are still a challenge.  Estroven and CBD did not help.   PCP:   Dr. Melford Aase  Patient's last menstrual period was 01/16/2013.           Sexually active: Yes.    The current method of family planning is post menopausal status.    Exercising: yes.  Weights and cardio 3 days a week, each.  Smoker:  no  Health Maintenance: Pap:  02-26-19 Neg:Neg HR HPV, 09-10-15 Neg:Neg HR HPV, 08-05-12 Neg:Neg HR HPV  History of abnormal Pap:  no MMG:  12/21/20, Neg, BiRads 1, Cat D Density Colonoscopy:  2021, neg per patient BMD:   2019  Result  Normal TDaP:  12/23/2015 Gardasil:   no HIV: No Hep C: Screening Labs:  PCP Covid boosters:  3.   reports that she has never smoked. She has never used smokeless tobacco. She reports current alcohol use. She reports that she does not use drugs.  Past Medical History:  Diagnosis Date   Fibroid    Postmenopausal bleeding     Past Surgical History:  Procedure Laterality Date   DILATATION & CURETTAGE/HYSTEROSCOPY WITH MYOSURE N/A 03/17/2019   Procedure: DILATATION & CURETTAGE/HYSTEROSCOPY WITH MYOSURE REMOVAL OF ENDOMETRIAL POLYP AND FIBROID/KS;  Surgeon: Nunzio Cobbs, MD;  Location: [facility];  Service: Gynecology;  Laterality: N/A;  removal of endometrial polyp and fibroid/ks   DILATATION & CURRETTAGE/HYSTEROSCOPY WITH RESECTOCOPE N/A 09/11/2012   Procedure: DILATATION & CURETTAGE/HYSTEROSCOPY WITH RESECTOCOPE;  Surgeon: Arloa Koh, MD;  Location: [facility];  Service: Gynecology;  Laterality: N/A;   knee surgery  1976; 2005,2004,2003   6 knee surgeries total - all right knee   WISDOM TOOTH EXTRACTION      Current Outpatient Medications  Medication Sig Dispense Refill   Zinc 50 MG CAPS      cholecalciferol (VITAMIN D) 1000 UNITS tablet Take 1,000 Units by mouth daily.     Multiple Vitamin (MULTI VITAMIN DAILY PO) Take by mouth.     tretinoin  (RETIN-A) 0.025 % cream Apply 1 application topically at bedtime.     No current facility-administered medications for this visit.    Family History  Problem Relation Age of Onset   Thyroid disease Mother    Hypertension Mother    Diabetes Father    Heart disease Father    Liver disease Father    Lymphoma Father     Review of Systems  All other systems reviewed and are negative.  Exam:   BP 122/62 (BP Location: Right Arm)   Pulse 95   Resp 18   Ht 5\' 5"  (1.651 m)   Wt 139 lb 9.6 oz (63.3 kg)   LMP 01/16/2013   SpO2 97%   BMI 23.23 kg/m     General appearance: alert, cooperative and appears stated age Head: normocephalic, without obvious abnormality, atraumatic Neck: no adenopathy, supple, symmetrical, trachea midline and thyroid normal to inspection and palpation Lungs: clear to auscultation bilaterally Breasts: normal appearance, no masses or tenderness, No nipple retraction or dimpling, No nipple discharge or bleeding, No axillary adenopathy Heart: regular rate and rhythm Abdomen: soft, non-tender; no masses, no organomegaly Extremities: extremities normal, atraumatic, no cyanosis or edema Skin: skin color, texture, turgor normal. No rashes or lesions Lymph nodes: cervical, supraclavicular, and axillary nodes normal. Neurologic: grossly normal  Pelvic: External genitalia:  no lesions  No abnormal inguinal nodes palpated.              Urethra:  normal appearing urethra with no masses, tenderness or lesions              Bartholins and Skenes: normal                 Vagina: normal appearing vagina with normal color and discharge, no lesions              Cervix: cervical ectropion with red patchy pattern on cervix.  Bleeds slightly with pap brush.               Pap taken: Yes.   Bimanual Exam:  Uterus:  normal size, contour, position, consistency, mobility, non-tender              Adnexa: no mass, fullness, tenderness              Rectal exam: Yes.  .   Confirms.              Anus:  normal sphincter tone, no lesions  Chaperone was present for exam:  Lawson Radar., RN  Assessment:   Well woman visit with gynecologic exam. Menopausal symptoms.  Fibroids.  Hx endometrial polyps.  Off HRT.  Plan: Mammogram screening discussed.  Report reviewed.  Self breast awareness reviewed. Pap and HR HPV collected.  Guidelines for Calcium, Vitamin D, regular exercise program including cardiovascular and weight bearing exercise. I discussed Paxil, Effexor, and Gabapentin.  She will let me know if she chooses to start a treatment.  Follow up annually and prn.   After visit summary provided.

## 2020-12-21 ENCOUNTER — Ambulatory Visit
Admission: RE | Admit: 2020-12-21 | Discharge: 2020-12-21 | Disposition: A | Discharge status: Home / Self Care | Payer: BC Managed Care – PPO | Source: Ambulatory Visit | Attending: Family Medicine | Admitting: Family Medicine

## 2020-12-21 DIAGNOSIS — Z1231 Encounter for screening mammogram for malignant neoplasm of breast: Secondary | ICD-10-CM

## 2020-12-22 ENCOUNTER — Encounter: Payer: Self-pay | Admitting: Obstetrics and Gynecology

## 2020-12-22 ENCOUNTER — Other Ambulatory Visit: Payer: Self-pay

## 2020-12-22 ENCOUNTER — Ambulatory Visit (INDEPENDENT_AMBULATORY_CARE_PROVIDER_SITE_OTHER): Payer: BC Managed Care – PPO | Admitting: Obstetrics and Gynecology

## 2020-12-22 ENCOUNTER — Other Ambulatory Visit (HOSPITAL_COMMUNITY)
Admission: RE | Admit: 2020-12-22 | Discharge: 2020-12-22 | Disposition: A | Discharge status: Home / Self Care | Payer: BC Managed Care – PPO | Source: Ambulatory Visit | Attending: Obstetrics and Gynecology | Admitting: Obstetrics and Gynecology

## 2020-12-22 VITALS — BP 122/62 | HR 95 | Resp 18 | Ht 65.0 in | Wt 139.6 lb

## 2020-12-22 DIAGNOSIS — Z124 Encounter for screening for malignant neoplasm of cervix: Secondary | ICD-10-CM | POA: Insufficient documentation

## 2020-12-22 DIAGNOSIS — Z01419 Encounter for gynecological examination (general) (routine) without abnormal findings: Secondary | ICD-10-CM | POA: Diagnosis not present

## 2020-12-22 NOTE — Patient Instructions (Signed)

## 2020-12-23 LAB — CYTOLOGY - PAP
Comment: NEGATIVE
Diagnosis: NEGATIVE
High risk HPV: NEGATIVE

## 2021-11-18 ENCOUNTER — Other Ambulatory Visit: Payer: Self-pay | Admitting: Obstetrics and Gynecology

## 2021-11-18 DIAGNOSIS — Z1231 Encounter for screening mammogram for malignant neoplasm of breast: Secondary | ICD-10-CM

## 2021-12-27 ENCOUNTER — Ambulatory Visit (INDEPENDENT_AMBULATORY_CARE_PROVIDER_SITE_OTHER): Payer: BC Managed Care – PPO | Admitting: Obstetrics and Gynecology

## 2021-12-27 ENCOUNTER — Encounter: Payer: Self-pay | Admitting: Obstetrics and Gynecology

## 2021-12-27 VITALS — BP 128/78 | HR 78 | Ht 65.0 in | Wt 146.0 lb

## 2021-12-27 DIAGNOSIS — N9089 Other specified noninflammatory disorders of vulva and perineum: Secondary | ICD-10-CM

## 2021-12-27 DIAGNOSIS — L819 Disorder of pigmentation, unspecified: Secondary | ICD-10-CM

## 2021-12-27 DIAGNOSIS — Z01419 Encounter for gynecological examination (general) (routine) without abnormal findings: Secondary | ICD-10-CM | POA: Diagnosis not present

## 2021-12-27 NOTE — Patient Instructions (Signed)

## 2021-12-27 NOTE — Progress Notes (Signed)
66 y.o. G74P0000 Divorced Caucasian female here for annual exam.    Still with hot flashes, not as frequent and less intense.  Using over the counter options.   Has had dermatology care and skin biopsy of the face and skin of left posterior calf.  No cancer.   Had Covid in October.   PCP:   Anastasia Pall, MD  Patient's last menstrual period was 01/16/2013.           Sexually active: Yes.    The current method of family planning is post menopausal status.    Exercising: Yes.    Walking and gym 3 days a week.  Smoker:  no  Health Maintenance: Pap:  12/22/2020 neg,hpv neg, 02-26-19 Neg:Neg HR HPV, 09-10-15 Neg:Neg HR HPV, 08-05-12 Neg:Neg HR HPV   History of abnormal Pap:  no MMG:  12/21/2020 ACR Breast Density Category d: BI-RADS CATEGORY 1: Negative.  Has appointment in January.  Colonoscopy:  2021 - [facility] - Due in 10 years.  BMD:  2019 normal TDaP:  2017 Gardasil:   no Screening Labs:  PCP   reports that she has never smoked. She has never used smokeless tobacco. She reports current alcohol use. She reports that she does not use drugs.  Past Medical History:  Diagnosis Date   Fibroid    Postmenopausal bleeding     Past Surgical History:  Procedure Laterality Date   DILATATION & CURETTAGE/HYSTEROSCOPY WITH MYOSURE N/A 03/17/2019   Procedure: DILATATION & CURETTAGE/HYSTEROSCOPY WITH MYOSURE REMOVAL OF ENDOMETRIAL POLYP AND FIBROID/KS;  Surgeon: Nunzio Cobbs, MD;  Location[facility];  Service: Gynecology;  Laterality: N/A;  removal of endometrial polyp and fibroid/ks   DILATATION & CURRETTAGE/HYSTEROSCOPY WITH RESECTOCOPE N/A 09/11/2012   Procedure: DILATATION & CURETTAGE/HYSTEROSCOPY WITH RESECTOCOPE;  Surgeon: Arloa Koh, MD;  Location: [facility];  Service: Gynecology;  Laterality: N/A;   knee surgery  1976; 2005,2004,2003   6 knee surgeries total - all right knee   WISDOM TOOTH EXTRACTION      Current Outpatient Medications  Medication Sig  Dispense Refill   cholecalciferol (VITAMIN D) 1000 UNITS tablet Take 1,000 Units by mouth daily.     Multiple Vitamin (MULTI VITAMIN DAILY PO) Take by mouth.     tretinoin (RETIN-A) 0.025 % cream Apply 1 application topically at bedtime.     Zinc 50 MG CAPS      No current facility-administered medications for this visit.    Family History  Problem Relation Age of Onset   Thyroid disease Mother    Hypertension Mother    Diabetes Father    Heart disease Father    Liver disease Father    Lymphoma Father     Review of Systems  All other systems reviewed and are negative.   Exam:   BP 128/78 (BP Location: Right Arm, Patient Position: Sitting, Cuff Size: Normal)   Pulse 78   Ht '5\' 5"'$  (1.651 m)   Wt 146 lb (66.2 kg)   LMP 01/16/2013   BMI 24.30 kg/m     General appearance: alert, cooperative and appears stated age Head: normocephalic, without obvious abnormality, atraumatic Neck: no adenopathy, supple, symmetrical, trachea midline and thyroid normal to inspection and palpation Lungs: clear to auscultation bilaterally Breasts: normal appearance, no masses or tenderness, No nipple retraction or dimpling, No nipple discharge or bleeding, No axillary adenopathy Heart: regular rate and rhythm Abdomen: soft, non-tender; no masses, no organomegaly Extremities: extremities normal, atraumatic, no cyanosis or  edema Skin: skin color, texture, turgor normal. No rashes or lesions Lymph nodes: cervical, supraclavicular, and axillary nodes normal. Neurologic: grossly normal  Pelvic: External genitalia:  scattered areas of hypopigmentation of the vulva in multiple areas.              No abnormal inguinal nodes palpated.              Urethra:  normal appearing urethra with no masses, tenderness or lesions              Bartholins and Skenes: normal                 Vagina: normal appearing vagina with normal color and discharge, no lesions              Cervix: no lesions              Pap  taken: no Bimanual Exam:  Uterus:  normal size, contour, position, consistency, mobility, non-tender              Adnexa: no mass, fullness, tenderness              Rectal exam: yes.  Confirms.              Anus:  normal sphincter tone, no lesions  Chaperone was present for exam:  Kimalexis  Assessment:   Well woman visit with gynecologic exam. Menopausal symptoms.  Fibroids.  Hx endometrial polyps.  Off HRT. Vulvar lesions.  Hypopigmentation of skin of vulva and extremities.   Plan: Mammogram screening discussed. Self breast awareness reviewed. Pap and HR HPV as above. Guidelines for Calcium, Vitamin D, regular exercise program including cardiovascular and weight bearing exercise. Return for vulvar biopsies.   RSV vaccine discussed.  Follow up annually and prn.   After visit summary provided.

## 2022-01-19 ENCOUNTER — Ambulatory Visit
Admission: RE | Admit: 2022-01-19 | Discharge: 2022-01-19 | Disposition: A | Discharge status: Home / Self Care | Payer: Medicare PPO | Source: Ambulatory Visit | Attending: Obstetrics and Gynecology | Admitting: Obstetrics and Gynecology

## 2022-01-19 DIAGNOSIS — Z1231 Encounter for screening mammogram for malignant neoplasm of breast: Secondary | ICD-10-CM

## 2022-01-19 NOTE — Progress Notes (Signed)
GYNECOLOGY  VISIT   HPI: 67 y.o.   Divorced  Caucasian  female   [place] with Patient's last menstrual period was 01/16/2013.   here for   vulvar bx.  She was noted to have hypopigmentation of the vulva at her routine visit on 12/27/21.  Some itching periodically, and this resolves.   Patient's mom fell and her husband is having shoulder surgery this week.   GYNECOLOGIC HISTORY: Patient's last menstrual period was 01/16/2013. Contraception:  post menopausal Menopausal hormone therapy:  n/a Last mammogram:  01/19/22 Breast Density Category C, BI-RADS CATEGORY 1 Neg Last pap smear:   12/22/2020 neg,hpv neg, 02-26-19 Neg:Neg HR HPV, 09-10-15 Neg:Neg HR HPV, 08-05-12 Neg:Neg HR HPV           OB History     Gravida  0   Para  0   Term  0   Preterm  0   AB  0   Living  0      SAB  0   IAB  0   Ectopic  0   Multiple  0   Live Births                 Patient Active Problem List   Diagnosis Date Noted   Chronic left hip pain 03/31/2016   Tendonosis 03/31/2016   Hamstring tendonitis at origin 11/06/2014   Ischial bursitis of left side 11/06/2014   Lumbar radiculopathy 11/06/2014   Neck pain 07/31/2013   Post menopausal syndrome 07/31/2013    Past Medical History:  Diagnosis Date   Fibroid    Postmenopausal bleeding     Past Surgical History:  Procedure Laterality Date   DILATATION & CURETTAGE/HYSTEROSCOPY WITH MYOSURE N/A 03/17/2019   Procedure: DILATATION & CURETTAGE/HYSTEROSCOPY WITH MYOSURE REMOVAL OF ENDOMETRIAL POLYP AND FIBROID/KS;  Surgeon: Nunzio Cobbs, MD;  Location: [facility];  Service: Gynecology;  Laterality: N/A;  removal of endometrial polyp and fibroid/ks   DILATATION & CURRETTAGE/HYSTEROSCOPY WITH RESECTOCOPE N/A 09/11/2012   Procedure: DILATATION & CURETTAGE/HYSTEROSCOPY WITH RESECTOCOPE;  Surgeon: Arloa Koh, MD;  Location: [facility];  Service: Gynecology;  Laterality: N/A;   knee surgery  1976; 2005,2004,2003   6  knee surgeries total - all right knee   WISDOM TOOTH EXTRACTION      Current Outpatient Medications  Medication Sig Dispense Refill   cholecalciferol (VITAMIN D) 1000 UNITS tablet Take 1,000 Units by mouth daily.     Multiple Vitamin (MULTI VITAMIN DAILY PO) Take by mouth.     tretinoin (RETIN-A) 0.025 % cream Apply 1 application topically at bedtime.     Zinc 50 MG CAPS      No current facility-administered medications for this visit.     ALLERGIES: Sodium hyaluronate, Cross-linked hyaluronate, Other, and Shrimp [shellfish allergy]  Family History  Problem Relation Age of Onset   Thyroid disease Mother    Hypertension Mother    Diabetes Father    Heart disease Father    Liver disease Father    Lymphoma Father     Social History   Socioeconomic History   Marital status: Divorced    Spouse name: Not on file   Number of children: Not on file   Years of education: Not on file   Highest education level: Not on file  Occupational History   Not on file  Tobacco Use   Smoking status: Never   Smokeless tobacco: Never  Vaping Use   Vaping Use: Never used  Substance and Sexual Activity   Alcohol use: Yes    Comment: occ glass of wine   Drug use: No   Sexual activity: Yes    Partners: Male    Birth control/protection: Post-menopausal  Other Topics Concern   Not on file  Social History Narrative   Not on file   Social Determinants of Health   Financial Resource Strain: Not on file  Food Insecurity: Not on file  Transportation Needs: Not on file  Physical Activity: Not on file  Stress: Not on file  Social Connections: Not on file  Intimate Partner Violence: Not on file    Review of Systems  All other systems reviewed and are negative.   PHYSICAL EXAMINATION:    BP 120/72 (BP Location: Left Arm, Patient Position: Sitting, Cuff Size: Normal)   Ht '5\' 5"'$  (1.651 m)   Wt 146 lb (66.2 kg)   LMP 01/16/2013   BMI 24.30 kg/m     General appearance: alert,  cooperative and appears stated age   Pelvic: External genitalia:  multiple tiny patches of hypopigmentation of the vulva. More concentrated hypopigmentation of the perineal body.         Procedure - vulvar biopsies.  Consent done.  Sterile prep with Hibiclens.  Local 1% lidocaine, lot [id number], exp 02/17/23 to left inferior labia majora and left perineal body.  3 mm punch biopsy used to biopsy each area, and specimens to pathology separately.  Silver nitrate applied to biopsy sites. Tiny bandaids placed.  Minimal EBL. No complications.                Chaperone was present for exam:  Raquel Sarna  ASSESSMENT  Vulvar lesions.   Hypopigmentation in patches.    PLAN  FU biopsies.  Post biopsy care reviewed.    An After Visit Summary was printed and given to the patient.

## 2022-01-31 ENCOUNTER — Ambulatory Visit (INDEPENDENT_AMBULATORY_CARE_PROVIDER_SITE_OTHER): Payer: Medicare PPO | Admitting: Obstetrics and Gynecology

## 2022-01-31 ENCOUNTER — Encounter: Payer: Self-pay | Admitting: Obstetrics and Gynecology

## 2022-01-31 ENCOUNTER — Other Ambulatory Visit (HOSPITAL_COMMUNITY)
Admission: RE | Admit: 2022-01-31 | Discharge: 2022-01-31 | Disposition: A | Discharge status: Home / Self Care | Payer: Medicare PPO | Source: Ambulatory Visit | Attending: Obstetrics and Gynecology | Admitting: Obstetrics and Gynecology

## 2022-01-31 DIAGNOSIS — N9089 Other specified noninflammatory disorders of vulva and perineum: Secondary | ICD-10-CM

## 2022-01-31 DIAGNOSIS — L819 Disorder of pigmentation, unspecified: Secondary | ICD-10-CM | POA: Insufficient documentation

## 2022-01-31 NOTE — Patient Instructions (Signed)
Vulva Biopsy, Care After The following information offers guidance on how to care for yourself after your procedure. Your health care provider may also give you more specific instructions. If you have problems or questions, contact your health care provider. What can I expect after the procedure? After the procedure, it is common to have: Slight bleeding from the biopsy site. Slight pain or discomfort at the biopsy site. Follow these instructions at home: Biopsy site care  Follow instructions from your health care provider about how to take care of your biopsy site. Make sure you: Clean the area using water and mild soap twice a day or as told by your health care provider. Gently pat the area dry. You may shower 24 hours after the procedure. If you were prescribed an antibiotic ointment, apply it as told by your health care provider. Do not stop using the antibiotic even if your condition improves. If told by your health care provider, take a sitz bath to help with pain and discomfort. This is a warm water bath that you take while sitting down. Do this as often as told by your health care provider. The water should only come up to your hips and cover your buttocks. You may pat the area dry with a soft, clean towel. Leave stitches (sutures), skin glue, or adhesive strips in place. These skin closures may need to stay in place for 2 weeks or longer. If adhesive strip edges start to loosen and curl up, you may trim the loose edges. Do not remove adhesive strips completely unless your health care provider tells you to do that. Check your biopsy site every day for signs of infection. It may be helpful to use a handheld mirror to do this. Check for: Redness, swelling, or more pain. More fluid or blood. Warmth. Pus or a bad smell. Do not rub the biopsy area after urinating. Gently pat the area dry or use a bottle filled with warm water (peri bottle) to clean the area. Gently wipe from front to  back. Lifestyle Wear loose, cotton underwear. Do not wear tight pants. For at least 1 week or until your health care provider approves: Do not use tampons, douche, or put anything inside your vagina. Do not have sex. Until your health care provider approves: Do not exercise, such as running or biking. Do not swim or use a hot tub. General instructions Take over-the-counter and prescription medicines only as told by your health care provider. Drink enough fluid to keep your urine pale yellow. Use a sanitary napkin until the bleeding stops. If told, put ice on the biopsy site. To do this: Place ice in a plastic bag. Place a towel between your skin and the bag. Leave the ice on for 20 minutes, 2-3 times a day. Remove the ice if your skin turns bright red. This is very important. If you cannot feel pain, heat, or cold, you have a greater risk of damage to the area. Keep all follow-up visits. This is important. Contact a health care provider if: You have redness, swelling, or more pain around your biopsy site. You have more fluid or blood coming from your biopsy site. Your biopsy site feels warm to the touch. Your pain is not controlled with medicine or ice packs. You have a fever or chills. Get help right away if: You have heavy bleeding from the vulva. You have pus or a bad smell coming from the biopsy site. You have abdominal pain. Summary After the procedure, it  is common to have slight bleeding and discomfort at the biopsy site. Follow instructions from your health care provider after your biopsy. Take sitz baths as told by your health care provider to help with pain and discomfort. Leave any sutures in place. Contact your health care provider if you notice any signs of infection around the biopsy site, including redness, swelling, more pain, more fluid or blood, or warmth. Keep all follow-up visits. This is important. This information is not intended to replace advice given to you  by your health care provider. Make sure you discuss any questions you have with your health care provider. Document Revised: 09/21/2020 Document Reviewed: 09/21/2020 Elsevier Patient Education  [place].

## 2022-02-02 LAB — SURGICAL PATHOLOGY

## 2022-02-07 ENCOUNTER — Encounter: Payer: Self-pay | Admitting: Obstetrics and Gynecology

## 2022-02-13 NOTE — Progress Notes (Unsigned)
GYNECOLOGY  VISIT   HPI: 67 y.o.   Divorced  Caucasian  female   [place] with Patient's last menstrual period was 01/16/2013.   here for discussion of lichen sclerosis on vulvar biopsies 01/31/22.   She had multiple areas of hypopigmentation noted.   Sporadic itching of the vulva.  Has used benadryl to control the itching.   GYNECOLOGIC HISTORY: Patient's last menstrual period was 01/16/2013. Contraception:  PMP Menopausal hormone therapy:  n/a Last mammogram:  01/19/22 Breast Density Category C, BI-RADS CATEGORY 1 Neg Last pap smear:   12/22/20 neg: HR HPV neg, 02/26/19 neg: HR HPV neg        OB History     Gravida  0   Para  0   Term  0   Preterm  0   AB  0   Living  0      SAB  0   IAB  0   Ectopic  0   Multiple  0   Live Births                 Patient Active Problem List   Diagnosis Date Noted   Chronic left hip pain 03/31/2016   Tendonosis 03/31/2016   Hamstring tendonitis at origin 11/06/2014   Ischial bursitis of left side 11/06/2014   Lumbar radiculopathy 11/06/2014   Neck pain 07/31/2013   Post menopausal syndrome 07/31/2013    Past Medical History:  Diagnosis Date   Fibroid    Lichen sclerosus of vulva    Postmenopausal bleeding     Past Surgical History:  Procedure Laterality Date   DILATATION & CURETTAGE/HYSTEROSCOPY WITH MYOSURE N/A 03/17/2019   Procedure: DILATATION & CURETTAGE/HYSTEROSCOPY WITH MYOSURE REMOVAL OF ENDOMETRIAL POLYP AND FIBROID/KS;  Surgeon: Nunzio Cobbs, MD;  Location: [facility];  Service: Gynecology;  Laterality: N/A;  removal of endometrial polyp and fibroid/ks   DILATATION & CURRETTAGE/HYSTEROSCOPY WITH RESECTOCOPE N/A 09/11/2012   Procedure: DILATATION & CURETTAGE/HYSTEROSCOPY WITH RESECTOCOPE;  Surgeon: Arloa Koh, MD;  Location: [facility];  Service: Gynecology;  Laterality: N/A;   knee surgery  1976; 2005,2004,2003   6 knee surgeries total - all right knee   WISDOM TOOTH EXTRACTION       Current Outpatient Medications  Medication Sig Dispense Refill   cholecalciferol (VITAMIN D) 1000 UNITS tablet Take 1,000 Units by mouth daily.     MAGNESIUM PO 360 mg.     Misc Natural Products (ADRENAL) CAPS Take by mouth.     Multiple Vitamin (MULTI VITAMIN DAILY PO) Take by mouth.     tretinoin (RETIN-A) 0.025 % cream Apply 1 application topically at bedtime.     Zinc 50 MG CAPS      No current facility-administered medications for this visit.     ALLERGIES: Sodium hyaluronate, Cross-linked hyaluronate, Other, and Shrimp [shellfish allergy]  Family History  Problem Relation Age of Onset   Thyroid disease Mother    Hypertension Mother    Diabetes Father    Heart disease Father    Liver disease Father    Lymphoma Father     Social History   Socioeconomic History   Marital status: Divorced    Spouse name: Not on file   Number of children: Not on file   Years of education: Not on file   Highest education level: Not on file  Occupational History   Not on file  Tobacco Use   Smoking status: Never   Smokeless tobacco: Never  Vaping Use   Vaping Use: Never used  Substance and Sexual Activity   Alcohol use: Yes    Comment: occ glass of wine   Drug use: No   Sexual activity: Yes    Partners: Male    Birth control/protection: Post-menopausal  Other Topics Concern   Not on file  Social History Narrative   Not on file   Social Determinants of Health   Financial Resource Strain: Not on file  Food Insecurity: Not on file  Transportation Needs: Not on file  Physical Activity: Not on file  Stress: Not on file  Social Connections: Not on file  Intimate Partner Violence: Not on file    Review of Systems  All other systems reviewed and are negative.   PHYSICAL EXAMINATION:    BP 126/72 (BP Location: Right Arm, Patient Position: Sitting, Cuff Size: Normal)   Pulse 88   Ht '5\' 5"'$  (1.651 m)   Wt 146 lb (66.2 kg)   LMP 01/16/2013   SpO2 98%   BMI 24.30 kg/m      General appearance: alert, cooperative and appears stated age   ASSESSMENT  Lichen sclerosus of the vulva.    PLAN  Lichen sclerosus discussed.  Rx for Valisone ointment to use prn.  Instructed in use.  Avoid irritants to vulva.  FU in 4 months for a recheck and prn.   An After Visit Summary was printed and given to the patient.

## 2022-02-14 ENCOUNTER — Ambulatory Visit: Payer: Medicare PPO | Admitting: Obstetrics and Gynecology

## 2022-02-14 ENCOUNTER — Encounter: Payer: Self-pay | Admitting: Obstetrics and Gynecology

## 2022-02-14 VITALS — BP 126/72 | HR 88 | Ht 65.0 in | Wt 146.0 lb

## 2022-02-14 DIAGNOSIS — L900 Lichen sclerosus et atrophicus: Secondary | ICD-10-CM

## 2022-02-14 MED ORDER — BETAMETHASONE VALERATE 0.1 % EX OINT: 1.0000 | TOPICAL_OINTMENT | Freq: Two times a day (BID) | CUTANEOUS | 1 refills | Status: DC

## 2022-02-14 NOTE — Patient Instructions (Signed)
Lichen Sclerosus Lichen sclerosus is a skin problem. It can happen on any part of the body, but it commonly involves the anal and genital areas. It can cause itching and discomfort in these areas. Treatment can help to control symptoms. When the genital area is affected, getting treatment is important because the condition can cause scarring that may lead to other problems if left untreated. What are the causes? The cause of this condition is not known. It may be related to an overactive immune system or a lack of certain hormones. Lichen sclerosus is not an infection or a fungus, and it is not passed from one person to another (non-contagious). What increases the risk? The following factors may make you more likely to develop this condition: You are a woman who has reached menopause. You are a man who was not circumcised. This condition may also develop for the first time in children, usually before they enter puberty. What are the signs or symptoms? Symptoms of this condition include: White areas (plaques) on the skin that may be thin and wrinkled, or thickened. Red and swollen patches (lesions) on the skin. Tears or cracks in the skin. Bruising. Blood blisters. Severe itching. Pain, itching, or burning when urinating. Constipation is also common in children with lichen sclerosus, but can be seen in adults. How is this diagnosed? This condition may be diagnosed with a physical exam. In some cases, a tissue sample may be removed to be checked under a microscope (biopsy). How is this treated? This condition may be treated with: Topical steroids. These are medicated creams or ointments that are applied over the affected areas. Medicines that are taken by mouth. Topical immunotherapy. These are medicated creams or ointments that are applied over the affected areas. They stimulate your immune system to fight the skin condition. This may be used if steroids are not effective. Surgery. This may  be needed in more severe cases that are causing problems such as scarring. Follow these instructions at home: Medicines Take over-the-counter and prescription medicines only as told by your health care provider. Use creams or ointments as told by your health care provider. Skin care Do not scratch the affected areas of skin. If you are a woman, be sure to keep the vaginal area as clean and dry as possible. Clean the affected area of skin gently with water only. Pat skin dry and avoid the use of rough towels or toilet paper. Avoid irritating skin products, including soap and scented lotions. Use emollient creams as directed by your health care provider to help reduce itching. General instructions Keep all follow-up visits. This is important. Your condition may cause constipation. To prevent or treat constipation, you may need to: Drink enough fluid to keep your urine pale yellow. Take over-the-counter or prescription medicines. Eat foods that are high in fiber, such as beans, whole grains, and fresh fruits and vegetables. Limit foods that are high in fat and processed sugars, such as fried or sweet foods. Contact a health care provider if: You have increasing redness, swelling, or pain in the affected area. You have fluid, blood, or pus coming from the affected area. You have new lesions on your skin. You have a fever. You have pain during sex. Get help right away if: You develop severe pain or burning in the affected areas, especially in the genital area. Summary Lichen sclerosus is a skin problem. When the genital area is affected, getting treatment is important because the condition can cause scarring that may   lead to other problems if left untreated. This condition is usually treated with medicated creams or ointments (topical steroids) that are applied over the affected areas. Take or use over-the-counter and prescription medicines only as told by your health care provider. Contact a  health care provider if you have new lesions on your skin, have pain during sex, or have increasing redness, swelling, or pain in the affected area. Keep all follow-up visits. This is important. This information is not intended to replace advice given to you by your health care provider. Make sure you discuss any questions you have with your health care provider. Document Revised: 05/17/2019 Document Reviewed: 05/17/2019 Elsevier Patient Education  2023 [employer]  

## 2022-03-14 ENCOUNTER — Other Ambulatory Visit: Payer: Self-pay

## 2022-03-14 ENCOUNTER — Encounter: Payer: Self-pay | Admitting: Internal Medicine

## 2022-03-14 ENCOUNTER — Ambulatory Visit: Payer: Medicare PPO | Admitting: Internal Medicine

## 2022-03-14 VITALS — BP 100/60 | HR 103 | Temp 98.4°F | Resp 12 | Ht 65.0 in | Wt 145.0 lb

## 2022-03-14 DIAGNOSIS — L500 Allergic urticaria: Secondary | ICD-10-CM | POA: Diagnosis not present

## 2022-03-14 DIAGNOSIS — J343 Hypertrophy of nasal turbinates: Secondary | ICD-10-CM | POA: Diagnosis not present

## 2022-03-14 DIAGNOSIS — T7802XA Anaphylactic reaction due to shellfish (crustaceans), initial encounter: Secondary | ICD-10-CM | POA: Diagnosis not present

## 2022-03-14 DIAGNOSIS — J3089 Other allergic rhinitis: Secondary | ICD-10-CM

## 2022-03-14 MED ORDER — EPINEPHRINE 0.3 MG/0.3ML IJ SOAJ: 0.3000 mg | INTRAMUSCULAR | 1 refills | Status: AC | PRN

## 2022-03-14 MED ORDER — CETIRIZINE HCL 10 MG PO TABS: 10.0000 mg | ORAL_TABLET | Freq: Every day | ORAL | 11 refills | Status: DC | PRN

## 2022-03-14 MED ORDER — AZELASTINE HCL 0.1 % NA SOLN: 1.0000 | Freq: Two times a day (BID) | NASAL | 11 refills | Status: DC | PRN

## 2022-03-14 NOTE — Progress Notes (Signed)
NEW PATIENT  Date of Service/Encounter:  03/14/22  Consult requested by: Chesley Noon, MD   Subjective:   Natasha Simmons (DOB: [birth year]) is a 67 y.o. female who presents to the clinic on 03/14/2022 with a chief complaint of Establish Care (Has been avoiding shrimp for over 30 years and now would like to retest to see if she is still allergic to shrimp and other seafood. ) and Allergy Testing (Shrimp and seafood (35 years ago)) .    History obtained from: chart review and patient.  Rhinitis:  Started a long time ago, years ago.  Symptoms include: nasal congestion, rhinorrhea, sneezing, watery eyes, and itchy eyes  Occurs seasonally-Spring/Summer Potential triggers: pollen Treatments tried:  Nasal saline rinses  Previous allergy testing: no History of reflux/heartburn: no History of sinus surgery: no Nonallergic triggers: none    Concern for Food Allergy:  Foods of concern: shrimp History of reaction:  Around age 62, she went to a meeting and had seafood at a [employer] and then a few hours later, she went home and was mowing her lawn and became itchy all over.  She also had hives all over and felt dizziness.  She went to the ER and gave her benadryl.  They gave her a lot of benadryl and she felt woozy and her BP was low so she had to be hospitalized overnight.  Discharged the next day.   Previous allergy testing yes; blood test was positive with mild sensitivity to shrimp.  She avoids shrimp, lobster and crab (crustaceans).   She eats scallops (molluscs).   She eats fish- haddock and salmon.   Carries an epinephrine autoinjector: no; she used to carry it but stopped due to the cost and quick expiration.     Past Medical History: Past Medical History:  Diagnosis Date   Fibroid    Lichen sclerosus of vulva    Postmenopausal bleeding    Urticaria    Past Surgical History: Past Surgical History:  Procedure Laterality Date   DILATATION &  CURETTAGE/HYSTEROSCOPY WITH MYOSURE N/A 03/17/2019   Procedure: DILATATION & CURETTAGE/HYSTEROSCOPY WITH MYOSURE REMOVAL OF ENDOMETRIAL POLYP AND FIBROID/KS;  Surgeon: Nunzio Cobbs, MD;  Location: [facility];  Service: Gynecology;  Laterality: N/A;  removal of endometrial polyp and fibroid/ks   DILATATION & CURRETTAGE/HYSTEROSCOPY WITH RESECTOCOPE N/A 09/11/2012   Procedure: DILATATION & CURETTAGE/HYSTEROSCOPY WITH RESECTOCOPE;  Surgeon: Arloa Koh, MD;  Location: [facility];  Service: Gynecology;  Laterality: N/A;   knee surgery  1976; 2005,2004,2003   6 knee surgeries total - all right knee   WISDOM TOOTH EXTRACTION      Family History: Family History  Problem Relation Age of Onset   Thyroid disease Mother    Hypertension Mother    Diabetes Father    Heart disease Father    Liver disease Father    Lymphoma Father     Social History:  Lives in a 16 year house Flooring in bedroom: wood Pets: Neurosurgeon and dog Tobacco use/exposure: none Job: retired Pharmacist, hospital  Medication List:  Allergies as of 03/14/2022       Reactions   Sodium Hyaluronate Other (See Comments)   Cross-linked Hyaluronate Other (See Comments)   edema   Other Other (See Comments)   Hyalgan - Edema    Shrimp [shellfish Allergy] Hives   itching        Medication List        Accurate as of March 14, 2022 11:59 AM. If you have any questions, ask your nurse or doctor.          Adrenal Caps Take by mouth.   betamethasone valerate ointment 0.1 % Commonly known as: VALISONE Apply 1 Application topically 2 (two) times daily. Apply to skin twice a day in a thin layer to area for 2 weeks at a time for a flare.  Then use at bedtime twice a week for maintenance dosing.   cholecalciferol 1000 units tablet Commonly known as: VITAMIN D Take 1,000 Units by mouth daily.   MAGNESIUM PO 360 mg.   MULTI VITAMIN DAILY PO Take by mouth.   tretinoin 0.025 % cream Commonly known as:  RETIN-A Apply 1 application topically at bedtime.   Zinc 50 MG Caps         REVIEW OF SYSTEMS: Pertinent positives and negatives discussed in HPI.   Objective:   Physical Exam: BP 100/60   Pulse (!) 103   Temp 98.4 F (36.9 C) (Temporal)   Resp 12   Ht '5\' 5"'$  (1.651 m)   Wt 145 lb (65.8 kg)   LMP 01/16/2013   SpO2 98%   BMI 24.13 kg/m  Body mass index is 24.13 kg/m. GEN: alert, well developed HEENT: clear conjunctiva, TM grey and translucent, nose with + inferior turbinate hypertrophy, pink nasal mucosa, slight clear rhinorrhea, no cobblestoning HEART: regular rate and rhythm, no murmur LUNGS: clear to auscultation bilaterally, no coughing, unlabored respiration ABDOMEN: soft, non distended  SKIN: no rashes or lesions  Reviewed:  02/14/2022: seen by Obgyn for vulva itching for which she was using benadryl.  Biopsy hsowed lichen sclerosis. Exam with multiple hypopigmented areas.  Given Valisone ointment to use PRN.    12/05/2021: seen by Loyal Buba PA for annual exam, given a prescription for Epipen likely for her shellfish allergy.   10/25/2021: seen at [facility] for fatigue, congestion, body aches, chills, sore throat.  COVID Positive.  Given molnupiravir.    Skin Testing:  Skin prick testing was placed, which includes aeroallergens/foods, histamine control, and saline control.  Verbal consent was obtained prior to placing test.  Patient tolerated procedure well.  Allergy testing results were read and interpreted by myself, documented by clinical staff. Adequate positive and negative control.  Results discussed with patient/family.  Airborne Adult Perc - 03/14/22 0934     Time Antigen Placed [id number]    Allergen Manufacturer Lavella Hammock    Location Back    Number of Test 58    1. Control-Buffer 50% Glycerol Negative    2. Control-Histamine 1 mg/ml 3+    3. Albumin saline Negative    4. [place] Negative    5. [place] Negative    6. Johnson Negative    7. [place] Blue Negative     9. Perennial Rye Negative    10. Sweet Vernal Negative    11. Timothy Negative    12. Cocklebur Negative    13. Burweed Marshelder Negative    14. Ragweed, short Negative    15. Ragweed, Giant Negative    16. Plantain,  English Negative    17. Lamb's Quarters Negative    18. Sheep Sorrell Negative    19. Rough Pigweed Negative    20. Marsh Elder, Rough Negative    21. Mugwort, Common Negative    22. Ash mix Negative    23. Birch mix Negative    24. Beech American Negative    25. Box, Elder Negative    26. Cedar, red  Negative    27. Cottonwood, [place] Negative    28. Elm mix Negative    29. Hickory Negative    30. Maple mix Negative    31. Oak, [place] mix Negative    32. Pecan Pollen Negative    33. Pine mix Negative    34. Sycamore Eastern Negative    35. [place], Black Pollen Negative    36. Alternaria alternata Negative    37. Cladosporium Herbarum Negative    38. Aspergillus mix Negative    39. Penicillium mix Negative    40. Bipolaris sorokiniana (Helminthosporium) Negative    41. Drechslera spicifera (Curvularia) Negative    42. Mucor plumbeus Negative    43. Fusarium moniliforme Negative    44. Aureobasidium pullulans (pullulara) Negative    45. Rhizopus oryzae Negative    46. Botrytis cinera Negative    47. Epicoccum nigrum Negative    48. Phoma betae Negative    49. Candida Albicans Negative    50. Trichophyton mentagrophytes Negative    51. Mite, D Farinae  5,000 AU/ml 3+    52. Mite, D Pteronyssinus  5,000 AU/ml Negative    53. Cat Hair 10,000 BAU/ml Negative    54.  Dog Epithelia Negative    55. Mixed Feathers Negative    56. Horse Epithelia Negative    57. Cockroach, German Negative    58. Mouse Negative    59. Tobacco Leaf Negative             Intradermal - 03/14/22 1047     Time Antigen Placed 1042    Allergen Manufacturer Lavella Hammock    Location Back    Number of Test 14             Food Adult Perc - 03/14/22 0900     Time Antigen  Placed 0940    Allergen Manufacturer Lavella Hammock    Location Back    Number of allergen test 4    8. Shellfish Mix --   3x2   25. Shrimp --   4x5   26. Crab --   3x3   27. Lobster --   5x6              Assessment:   1. 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 Perennial allergic rhinitis   3. 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 Nasal turbinate hypertrophy     Plan/Recommendations:  Allergic Rhinitis: - Due to turbinate hypertrophy, seasonal symptoms and unresponsive to OTC meds, performed skin testing to identify aeroallergen triggers.   - Positive skin test 02/2022: dust mite, cockroach  - Avoidance measures discussed. - Use nasal saline rinses before nose sprays such as with Neilmed Sinus Rinse.  Use distilled water.   - Use Azelastine 1-2 sprays each nostril twice daily as needed. Aim upward and outward. - Use Zyrtec 10 mg daily as needed for runny nose, sneezing, itchy watery eyes.  - Consider allergy shots as long term control of your symptoms by teaching your immune system to be more tolerant of your allergy triggers  Food allergy:  - initial rxn concerning for anaphylaxis with hives, dizziness, low BP.   - today's skin testing was positive to shrimp, lobster, crab.   - please strictly avoid crustaceans (shrimp, lobster, crab, crawfish) - for SKIN only reaction, okay to take Benadryl '25mg'$  capsules every 6 hours - for SKIN + ANY additional symptoms, OR IF concern for LIFE THREATENING reaction = Epipen Autoinjector EpiPen 0.3  mg. - If using Epinephrine autoinjector, call 911 or go the emergency room.       Return in about 3 months (around 06/12/2022).  Harlon Flor, MD [facility]

## 2022-03-14 NOTE — Patient Instructions (Addendum)
Natasha Simmons  Allergic Rhinitis: - Positive skin test 02/2022: dust mite, cockroach  - Avoidance measures discussed. - Use nasal saline rinses before nose sprays such as with Neilmed Sinus Rinse.  Use distilled water.   - Use Azelastine 1-2 sprays each nostril twice daily as needed. Aim upward and outward. - Use Zyrtec 10 mg daily as needed for runny nose, sneezing, itchy watery eyes.  - Consider allergy shots as long term control of your symptoms by teaching your immune system to be more tolerant of your allergy triggers  Food allergy:  - today's skin testing was positive to shrimp, lobster, crab.   - please strictly avoid crustaceans (shrimp, lobster, crab, crawfish) - for SKIN only reaction, okay to take Benadryl '25mg'$  capsules every 6 hours - for SKIN + ANY additional symptoms, OR IF concern for LIFE THREATENING reaction = Epipen Autoinjector EpiPen 0.3 mg. - If using Epinephrine autoinjector, call 911 or go the emergency room.    ALLERGEN AVOIDANCE MEASURES Dust Mites Use central air conditioning and heat; and change the filter monthly.  Pleated filters work better than mesh filters.  Electrostatic filters may also be used; wash the filter monthly.  Window air conditioners may be used, but do not clean the air as well as a central air conditioner.  Change or wash the filter monthly. Keep windows closed.  Do not use attic fans.   Encase the mattress, box springs and pillows with zippered, dust proof covers. Wash the bed linens in hot water weekly.   Remove carpet, especially from the bedroom. Remove stuffed animals, throw pillows, dust ruffles, heavy drapes and other items that collect dust from the bedroom. Do not use a humidifier.   Use wood, vinyl or leather furniture instead of cloth furniture in the bedroom. Keep the indoor humidity at 30 - 40%.  Monitor with a humidity gauge.  Cockroach Limit spread of food around the house; especially keep food out of bedrooms. Keep food  and garbage in closed containers with a tight lid.  Never leave food out in the kitchen.  Do not leave out pet food or dirty food bowls. Mop the kitchen floor and wash countertops at least once a week. Repair leaky pipes and faucets so there is no standing water to attract roaches. Plug up cracks in the house through which cockroaches can enter. Use bait stations and approved pesticides to reduce cockroach infestation.  Return in about 3 months (around 06/12/2022).

## 2022-04-28 ENCOUNTER — Encounter: Payer: Self-pay | Admitting: Obstetrics and Gynecology

## 2022-04-30 ENCOUNTER — Other Ambulatory Visit: Payer: Self-pay | Admitting: Obstetrics and Gynecology

## 2022-04-30 DIAGNOSIS — G479 Sleep disorder, unspecified: Secondary | ICD-10-CM

## 2022-04-30 NOTE — Progress Notes (Signed)
Order for referral to Dr. Vickey Huger.

## 2022-05-22 NOTE — Progress Notes (Deleted)
GYNECOLOGY  VISIT   HPI: 67 y.o.   Divorced  Caucasian  female   [id number] with Patient's last menstrual period was 01/16/2013.   here for   4 mo recheck  GYNECOLOGIC HISTORY: Patient's last menstrual period was 01/16/2013. Contraception:  PMP Menopausal hormone therapy:  n/a Last mammogram:  01/19/22 Breast Density Category C, BI-RADS CATEGORY 1 Neg  Last pap smear:   12/22/20 neg: HR HPV neg, 02/26/19 neg: HR HPV neg         OB History     Gravida  0   Para  0   Term  0   Preterm  0   AB  0   Living  0      SAB  0   IAB  0   Ectopic  0   Multiple  0   Live Births                 Patient Active Problem List   Diagnosis Date Noted   Chronic left hip pain 03/31/2016   Tendonosis 03/31/2016   Hamstring tendonitis at origin 11/06/2014   Ischial bursitis of left side 11/06/2014   Lumbar radiculopathy 11/06/2014   Neck pain 07/31/2013   Post menopausal syndrome 07/31/2013    Past Medical History:  Diagnosis Date   Fibroid    Lichen sclerosus of vulva    Postmenopausal bleeding    Urticaria     Past Surgical History:  Procedure Laterality Date   DILATATION & CURETTAGE/HYSTEROSCOPY WITH MYOSURE N/A 03/17/2019   Procedure: DILATATION & CURETTAGE/HYSTEROSCOPY WITH MYOSURE REMOVAL OF ENDOMETRIAL POLYP AND FIBROID/KS;  Surgeon: Patton Salles, MD;  Location: [facility];  Service: Gynecology;  Laterality: N/A;  removal of endometrial polyp and fibroid/ks   DILATATION & CURRETTAGE/HYSTEROSCOPY WITH RESECTOCOPE N/A 09/11/2012   Procedure: DILATATION & CURETTAGE/HYSTEROSCOPY WITH RESECTOCOPE;  Surgeon: Melony Overly, MD;  Location: [facility];  Service: Gynecology;  Laterality: N/A;   knee surgery  1976; 2005,2004,2003   6 knee surgeries total - all right knee   WISDOM TOOTH EXTRACTION      Current Outpatient Medications  Medication Sig Dispense Refill   azelastine (ASTELIN) 0.1 % nasal spray Place 1 spray into both nostrils 2 (two) times  daily as needed for rhinitis. Use in each nostril as directed 30 mL 11   betamethasone valerate ointment (VALISONE) 0.1 % Apply 1 Application topically 2 (two) times daily. Apply to skin twice a day in a thin layer to area for 2 weeks at a time for a flare.  Then use at bedtime twice a week for maintenance dosing. 45 g 1   cetirizine (ZYRTEC ALLERGY) 10 MG tablet Take 1 tablet (10 mg total) by mouth daily as needed for allergies. 30 tablet 11   cholecalciferol (VITAMIN D) 1000 UNITS tablet Take 1,000 Units by mouth daily.     EPINEPHrine (EPIPEN 2-PAK) 0.3 mg/0.3 mL IJ SOAJ injection Inject 0.3 mg into the muscle as needed for anaphylaxis. 2 each 1   MAGNESIUM PO 360 mg.     Misc Natural Products (ADRENAL) CAPS Take by mouth.     Multiple Vitamin (MULTI VITAMIN DAILY PO) Take by mouth.     tretinoin (RETIN-A) 0.025 % cream Apply 1 application topically at bedtime.     Zinc 50 MG CAPS      No current facility-administered medications for this visit.     ALLERGIES: Sodium hyaluronate, Cross-linked hyaluronate, Other, and Shrimp [shellfish allergy]  Family History  Problem Relation Age of Onset   Thyroid disease Mother    Hypertension Mother    Diabetes Father    Heart disease Father    Liver disease Father    Lymphoma Father     Social History   Socioeconomic History   Marital status: Divorced    Spouse name: Not on file   Number of children: Not on file   Years of education: Not on file   Highest education level: Not on file  Occupational History   Not on file  Tobacco Use   Smoking status: Never    Passive exposure: Never   Smokeless tobacco: Never  Vaping Use   Vaping Use: Never used  Substance and Sexual Activity   Alcohol use: Yes    Comment: occ glass of wine   Drug use: No   Sexual activity: Yes    Partners: Male    Birth control/protection: Post-menopausal  Other Topics Concern   Not on file  Social History Narrative   Not on file   Social Determinants of  Health   Financial Resource Strain: Not on file  Food Insecurity: Not on file  Transportation Needs: Not on file  Physical Activity: Not on file  Stress: Not on file  Social Connections: Not on file  Intimate Partner Violence: Not on file    Review of Systems  PHYSICAL EXAMINATION:    LMP 01/16/2013     General appearance: alert, cooperative and appears stated age Head: Normocephalic, without obvious abnormality, atraumatic Neck: no adenopathy, supple, symmetrical, trachea midline and thyroid normal to inspection and palpation Lungs: clear to auscultation bilaterally Breasts: normal appearance, no masses or tenderness, No nipple retraction or dimpling, No nipple discharge or bleeding, No axillary or supraclavicular adenopathy Heart: regular rate and rhythm Abdomen: soft, non-tender, no masses,  no organomegaly Extremities: extremities normal, atraumatic, no cyanosis or edema Skin: Skin color, texture, turgor normal. No rashes or lesions Lymph nodes: Cervical, supraclavicular, and axillary nodes normal. No abnormal inguinal nodes palpated Neurologic: Grossly normal  Pelvic: External genitalia:  no lesions              Urethra:  normal appearing urethra with no masses, tenderness or lesions              Bartholins and Skenes: normal                 Vagina: normal appearing vagina with normal color and discharge, no lesions              Cervix: no lesions                Bimanual Exam:  Uterus:  normal size, contour, position, consistency, mobility, non-tender              Adnexa: no mass, fullness, tenderness              Rectal exam: {yes no:[id number]}.  Confirms.              Anus:  normal sphincter tone, no lesions  Chaperone was present for exam:  ***  ASSESSMENT     PLAN     An After Visit Summary was printed and given to the patient.  ______ minutes face to face time of which over 50% was spent in counseling.

## 2022-05-24 ENCOUNTER — Encounter: Payer: Self-pay | Admitting: Neurology

## 2022-05-24 ENCOUNTER — Ambulatory Visit (INDEPENDENT_AMBULATORY_CARE_PROVIDER_SITE_OTHER): Payer: Medicare PPO | Admitting: Neurology

## 2022-05-24 VITALS — BP 137/77 | HR 76 | Ht 65.0 in | Wt 142.0 lb

## 2022-05-24 DIAGNOSIS — F5103 Paradoxical insomnia: Secondary | ICD-10-CM | POA: Insufficient documentation

## 2022-05-24 DIAGNOSIS — N951 Menopausal and female climacteric states: Secondary | ICD-10-CM

## 2022-05-24 MED ORDER — MAGNESIUM 250 MG PO TABS: ORAL_TABLET | ORAL | 0 refills | Status: DC

## 2022-05-24 NOTE — Patient Instructions (Signed)
Insomnia Insomnia is a sleep disorder that makes it difficult to fall asleep or stay asleep. Insomnia can cause fatigue, low energy, difficulty concentrating, mood swings, and poor performance at work or school. There are three different ways to classify insomnia: Difficulty falling asleep. Difficulty staying asleep. Waking up too early in the morning. Any type of insomnia can be long-term (chronic) or short-term (acute). Both are common. Short-term insomnia usually lasts for 3 months or less. Chronic insomnia occurs at least three times a week for longer than 3 months. What are the causes? Insomnia may be caused by another condition, situation, or substance, such as: Having certain mental health conditions, such as anxiety and depression. Using caffeine, alcohol, tobacco, or drugs. Having gastrointestinal conditions, such as gastroesophageal reflux disease (GERD). Having certain medical conditions. These include: Asthma. Alzheimer's disease. Stroke. Chronic pain. An overactive thyroid gland (hyperthyroidism). Other sleep disorders, such as restless legs syndrome and sleep apnea. Menopause. Sometimes, the cause of insomnia may not be known.Trazodone Tablets What is this medication? TRAZODONE (TRAZ oh done) treats depression. It increases the amount of serotonin in the brain, a hormone that helps regulate mood. This medicine may be used for other purposes; ask your health care provider or pharmacist if you have questions. COMMON BRAND NAME(S): Desyrel What should I tell my care team before I take this medication? They need to know if you have any of these conditions: Attempted suicide or thinking about it Bipolar disorder Bleeding problems Glaucoma Heart disease, or previous heart attack Irregular heart beat Kidney or liver disease Low levels of sodium in the blood An unusual or allergic reaction to trazodone, other medications, foods, dyes or preservatives Pregnant or trying to  get pregnant Breast-feeding How should I use this medication? Take this medication by mouth with a glass of water. Follow the directions on the prescription label. Take this medication shortly after a meal or a light snack. Take your medication at regular intervals. Do not take your medication more often than directed. Do not stop taking this medication suddenly except upon the advice of your care team. Stopping this medication too quickly may cause serious side effects or your condition may worsen. A special MedGuide will be given to you by the pharmacist with each prescription and refill. Be sure to read this information carefully each time. Talk to your care team regarding the use of this medication in children. Special care may be needed. Overdosage: If you think you have taken too much of this medicine contact a poison control center or emergency room at once. NOTE: This medicine is only for you. Do not share this medicine with others. What if I miss a dose? If you miss a dose, take it as soon as you can. If it is almost time for your next dose, take only that dose. Do not take double or extra doses. What may interact with this medication? Do not take this medication with any of the following: Certain medications for fungal infections like fluconazole, itraconazole, ketoconazole, posaconazole, voriconazole Cisapride Dronedarone Linezolid MAOIs like Carbex, Eldepryl, Marplan, Nardil, and Parnate Mesoridazine Methylene blue (injected into a vein) Pimozide Saquinavir Thioridazine This medication may also interact with the following: Alcohol Antiviral medications for HIV or AIDS Aspirin and aspirin-like medications Barbiturates like phenobarbital Certain medications for blood pressure, heart disease, irregular heart beat Certain medications for depression, anxiety, or psychotic disturbances Certain medications for migraine headache like almotriptan, eletriptan, frovatriptan,  naratriptan, rizatriptan, sumatriptan, zolmitriptan Certain medications for seizures like carbamazepine and phenytoin  Certain medications for sleep Certain medications that treat or prevent blood clots like dalteparin, enoxaparin, warfarin Digoxin Fentanyl Lithium NSAIDS, medications for pain and inflammation, like ibuprofen or naproxen Other medications that prolong the QT interval (cause an abnormal heart rhythm) like dofetilide Rasagiline Supplements like St. John's wort, kava kava, valerian Tramadol Tryptophan This list may not describe all possible interactions. Give your health care provider a list of all the medicines, herbs, non-prescription drugs, or dietary supplements you use. Also tell them if you smoke, drink alcohol, or use illegal drugs. Some items may interact with your medicine. What should I watch for while using this medication? Tell your care team if your symptoms do not get better or if they get worse. Visit your care team for regular checks on your progress. Because it may take several weeks to see the full effects of this medication, it is important to continue your treatment as prescribed by your care team. Watch for new or worsening thoughts of suicide or depression. This includes sudden changes in mood, behaviors, or thoughts. These changes can happen at any time but are more common in the beginning of treatment or after a change in dose. Call your care team right away if you experience these thoughts or worsening depression. Manic episodes may happen in patients with bipolar disorder who take this medication. Watch for changes in feelings or behaviors such as feeling anxious, nervous, agitated, panicky, irritable, hostile, aggressive, impulsive, severely restless, overly excited and hyperactive, or trouble sleeping. These changes can happen at any time but are more common in the beginning of treatment or after a change in dose. Call your care team right away if you notice  any of these symptoms. You may get drowsy or dizzy. Do not drive, use machinery, or do anything that needs mental alertness until you know how this medication affects you. Do not stand or sit up quickly, especially if you are an older patient. This reduces the risk of dizzy or fainting spells. Alcohol may interfere with the effect of this medication. Avoid alcoholic drinks. This medication may cause dry eyes and blurred vision. If you wear contact lenses you may feel some discomfort. Lubricating drops may help. See your eye doctor if the problem does not go away or is severe. Your mouth may get dry. Chewing sugarless gum, sucking hard candy and drinking plenty of water may help. Contact your care team if the problem does not go away or is severe. What side effects may I notice from receiving this medication? Side effects that you should report to your care team as soon as possible: Allergic reactions--skin rash, itching, hives, swelling of the face, lips, tongue, or throat Bleeding--bloody or black, tar-like stools, red or dark brown urine, vomiting blood or brown material that looks like coffee grounds, small, red or purple spots on skin, unusual bleeding or bruising Heart rhythm changes--fast or irregular heartbeat, dizziness, feeling faint or lightheaded, chest pain, trouble breathing Low blood pressure--dizziness, feeling faint or lightheaded, blurry vision Low sodium level--muscle weakness, fatigue, dizziness, headache, confusion Prolonged or painful erection Serotonin syndrome--irritability, confusion, fast or irregular heartbeat, muscle stiffness, twitching muscles, sweating, high fever, seizures, chills, vomiting, diarrhea Sudden eye pain or change in vision such as blurry vision, seeing halos around lights, vision loss Thoughts of suicide or self-harm, worsening mood, feelings of depression Side effects that usually do not require medical attention (report to your care team if they continue  or are bothersome): Change in sex drive or performance Constipation  Dizziness Drowsiness Dry mouth This list may not describe all possible side effects. Call your doctor for medical advice about side effects. You may report side effects to FDA at 1-800-FDA-1088. Where should I keep my medication? Keep out of the reach of children and pets. Store at room temperature between 15 and 30 degrees C (59 to 86 degrees F). Protect from light. Keep container tightly closed. Throw away any unused medication after the expiration date. NOTE: This sheet is a summary. It may not cover all possible information. If you have questions about this medicine, talk to your doctor, pharmacist, or health care provider.  2023 Elsevier/Gold Standard (2007-02-23 00:00:00)  What increases the risk? Risk factors for insomnia include: Gender. Females are affected more often than males. Age. Insomnia is more common as people get older. Stress and certain medical and mental health conditions. Lack of exercise. Having an irregular work schedule. This may include working night shifts and traveling between different time zones. What are the signs or symptoms? If you have insomnia, the main symptom is having trouble falling asleep or having trouble staying asleep. This may lead to other symptoms, such as: Feeling tired or having low energy. Feeling nervous about going to sleep. Not feeling rested in the morning. Having trouble concentrating. Feeling irritable, anxious, or depressed. How is this diagnosed? This condition may be diagnosed based on: Your symptoms and medical history. Your health care provider may ask about: Your sleep habits. Any medical conditions you have. Your mental health. A physical exam. How is this treated? Treatment for insomnia depends on the cause. Treatment may focus on treating an underlying condition that is causing the insomnia. Treatment may also include: Medicines to help you  sleep. Counseling or therapy. Lifestyle adjustments to help you sleep better. Follow these instructions at home: Eating and drinking  Limit or avoid alcohol, caffeinated beverages, and products that contain nicotine and tobacco, especially close to bedtime. These can disrupt your sleep. Do not eat a large meal or eat spicy foods right before bedtime. This can lead to digestive discomfort that can make it hard for you to sleep. Sleep habits  Keep a sleep diary to help you and your health care provider figure out what could be causing your insomnia. Write down: When you sleep. When you wake up during the night. How well you sleep and how rested you feel the next day. Any side effects of medicines you are taking. What you eat and drink. Make your bedroom a dark, comfortable place where it is easy to fall asleep. Put up shades or blackout curtains to block light from outside. Use a white noise machine to block noise. Keep the temperature cool. Limit screen use before bedtime. This includes: Not watching TV. Not using your smartphone, tablet, or computer. Stick to a routine that includes going to bed and waking up at the same times every day and night. This can help you fall asleep faster. Consider making a quiet activity, such as reading, part of your nighttime routine. Try to avoid taking naps during the day so that you sleep better at night. Get out of bed if you are still awake after 15 minutes of trying to sleep. Keep the lights down, but try reading or doing a quiet activity. When you feel sleepy, go back to bed. General instructions Take over-the-counter and prescription medicines only as told by your health care provider. Exercise regularly as told by your health care provider. However, avoid exercising in the hours right  before bedtime. Use relaxation techniques to manage stress. Ask your health care provider to suggest some techniques that may work well for you. These may  include: Breathing exercises. Routines to release muscle tension. Visualizing peaceful scenes. Make sure that you drive carefully. Do not drive if you feel very sleepy. Keep all follow-up visits. This is important. Contact a health care provider if: You are tired throughout the day. You have trouble in your daily routine due to sleepiness. You continue to have sleep problems, or your sleep problems get worse. Get help right away if: You have thoughts about hurting yourself or someone else. Get help right away if you feel like you may hurt yourself or others, or have thoughts about taking your own life. Go to your nearest emergency room or: Call 911. Call the National Suicide Prevention Lifeline at (978)127-1689 or 988. This is open 24 hours a day. Text the Crisis Text Line at 936-842-8929. Summary Insomnia is a sleep disorder that makes it difficult to fall asleep or stay asleep. Insomnia can be long-term (chronic) or short-term (acute). Treatment for insomnia depends on the cause. Treatment may focus on treating an underlying condition that is causing the insomnia. Keep a sleep diary to help you and your health care provider figure out what could be causing your insomnia. This information is not intended to replace advice given to you by your health care provider. Make sure you discuss any questions you have with your health care provider. Document Revised: 12/13/2020 Document Reviewed: 12/13/2020 Elsevier Patient Education  2023 [employer].
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
## 2022-05-26 IMAGING — MG MM DIGITAL SCREENING BILAT W/ TOMO AND CAD
8 series · 8 of 24 positions shown · non-contrast
Comparison: Previous exam(s).

CLINICAL DATA: Screening.

EXAM:
DIGITAL SCREENING BILATERAL MAMMOGRAM WITH TOMOSYNTHESIS AND CAD
TECHNIQUE: Bilateral screening digital craniocaudal and mediolateral oblique
mammograms were obtained. Bilateral screening digital breast
tomosynthesis was performed. The images were evaluated with
computer-aided detection.

[R MLO synth-2D]
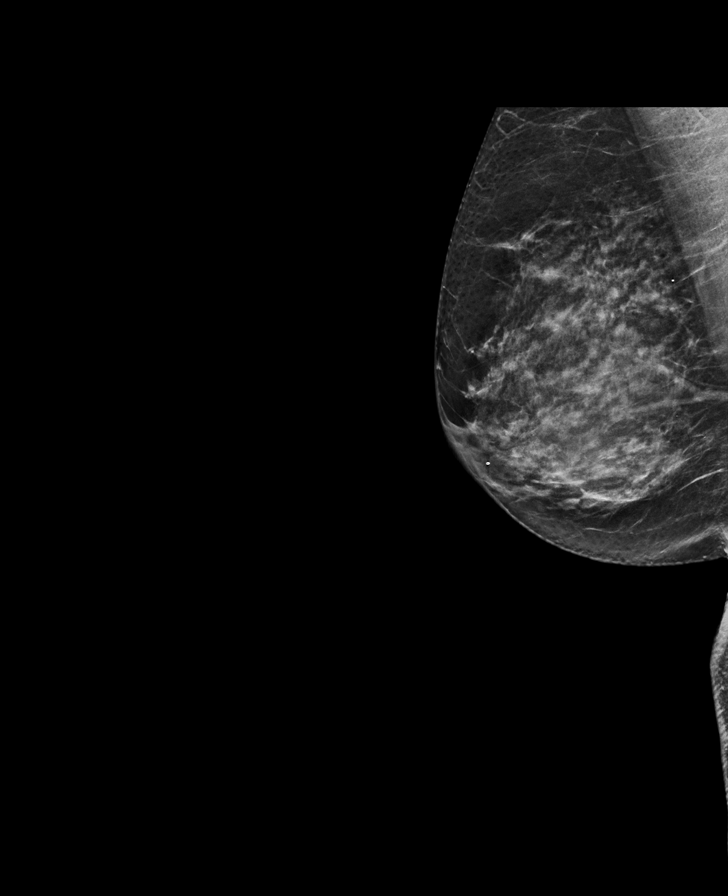

[L CC synth-2D]
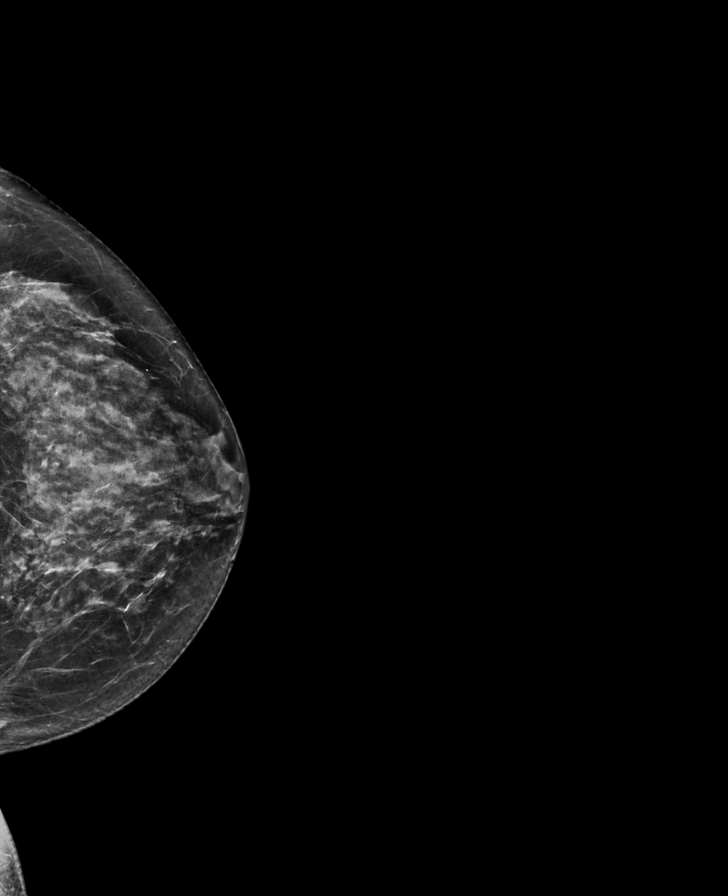

[R CC synth-2D]
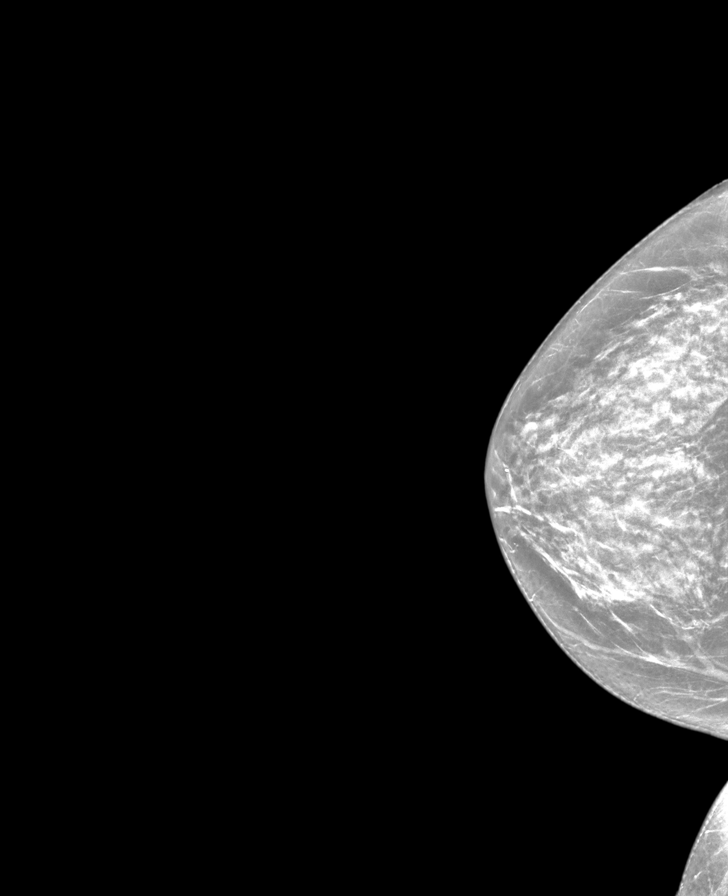

[L MLO synth-2D]
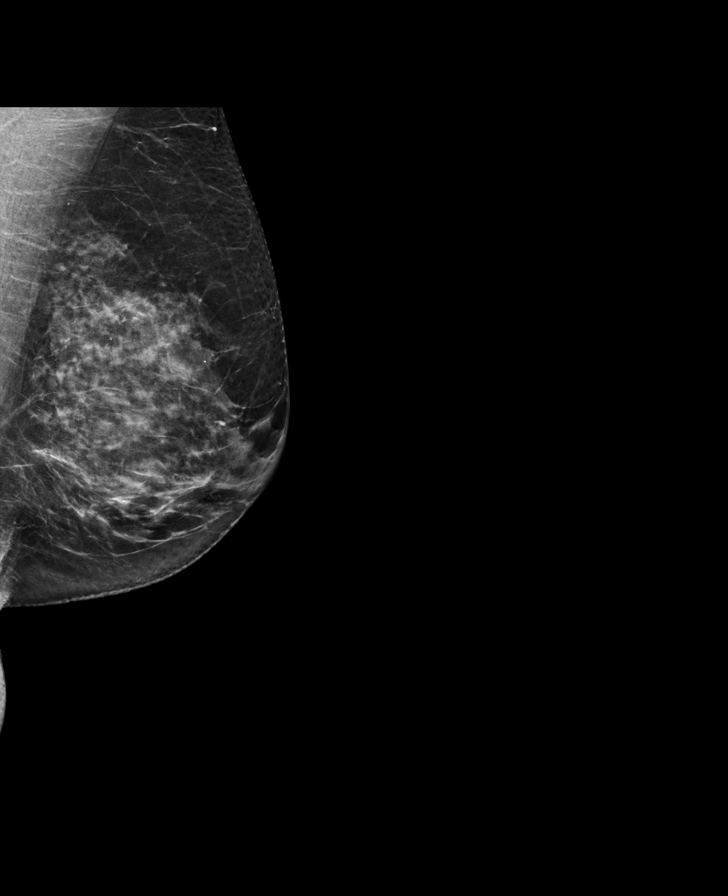

[L CC tomo · tomo slice 33/64.0]
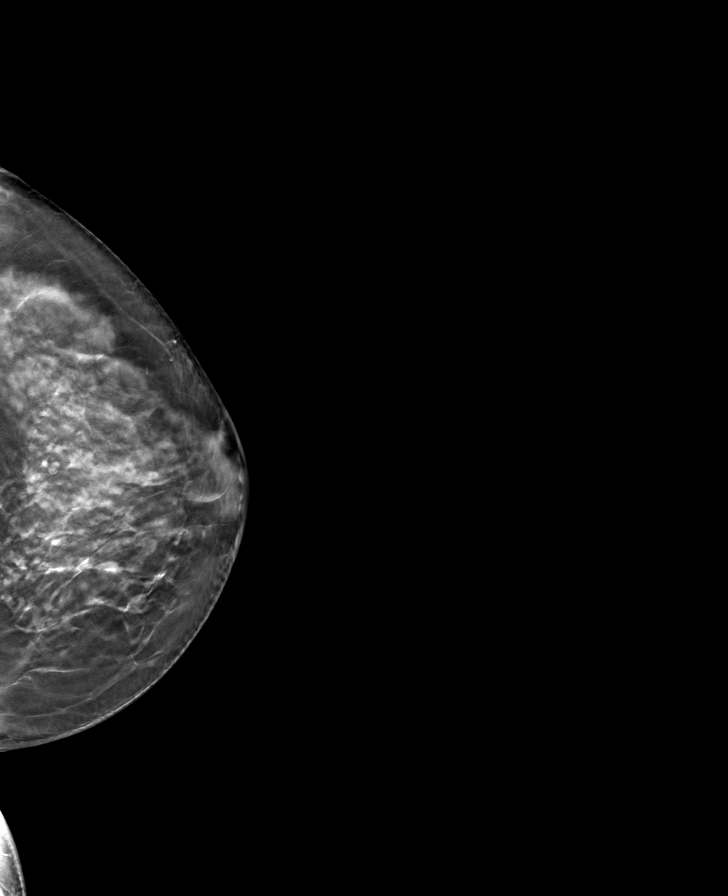

[R CC tomo · tomo slice 31/61.0]
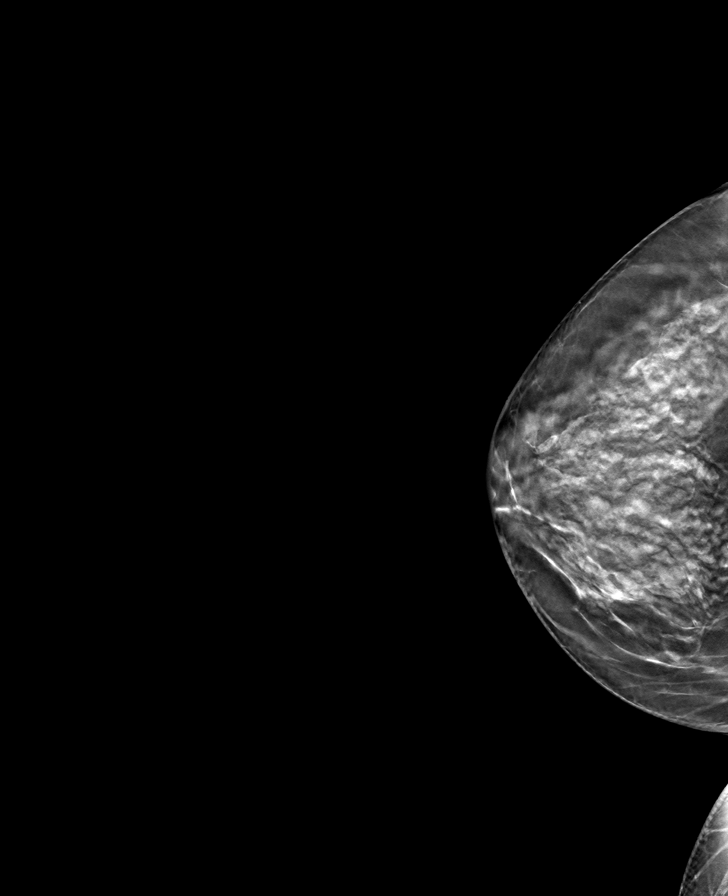

[L MLO tomo · tomo slice 34/67.0]
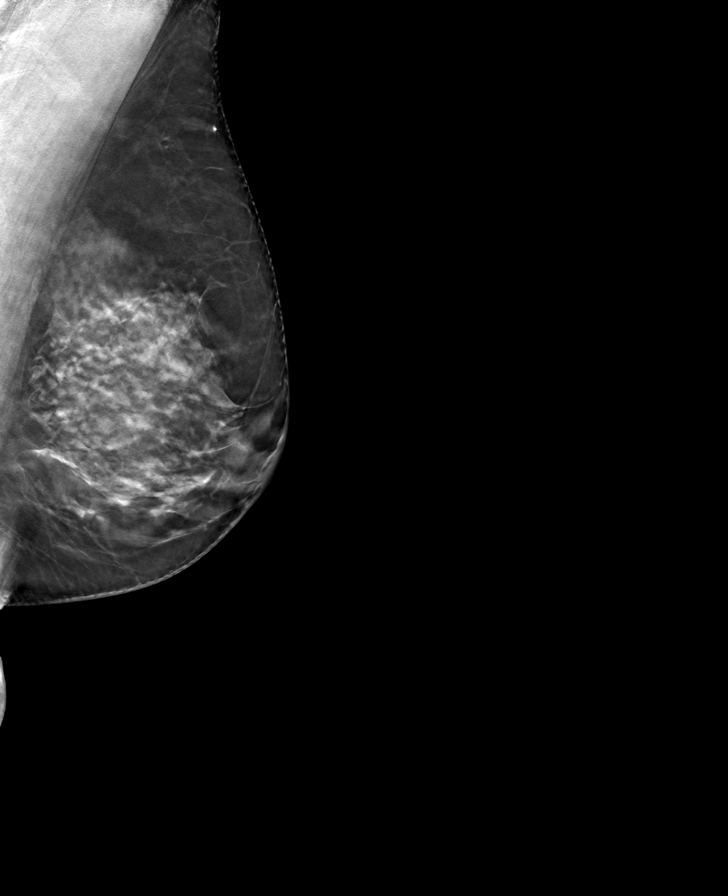

[R MLO tomo · tomo slice 32/63.0]
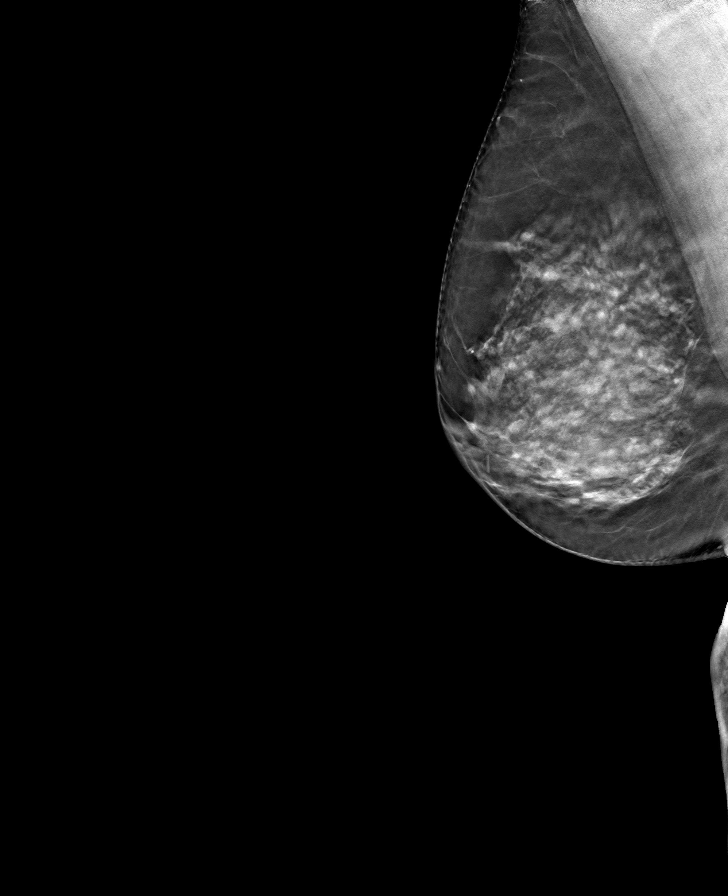

[8 of 24 positions shown; findings below may reference images not displayed]

ACR Breast Density Category d: The breast tissue is extremely dense,
which lowers the sensitivity of mammography
FINDINGS: There are no findings suspicious for malignancy.
IMPRESSION: No mammographic evidence of malignancy. A result letter of this
screening mammogram will be mailed directly to the patient.

RECOMMENDATION:
Screening mammogram in one year. (Code:TA-V-WV9)

BI-RADS CATEGORY  1: Negative.

## 2022-06-02 ENCOUNTER — Encounter: Payer: Self-pay | Admitting: Neurology

## 2022-06-05 ENCOUNTER — Encounter: Payer: Self-pay | Admitting: Obstetrics and Gynecology

## 2022-06-05 ENCOUNTER — Ambulatory Visit (INDEPENDENT_AMBULATORY_CARE_PROVIDER_SITE_OTHER): Payer: Medicare PPO | Admitting: Neurology

## 2022-06-05 ENCOUNTER — Telehealth: Payer: Self-pay | Admitting: Neurology

## 2022-06-05 ENCOUNTER — Ambulatory Visit: Payer: Medicare PPO | Admitting: Obstetrics and Gynecology

## 2022-06-05 VITALS — BP 128/70 | HR 76 | Ht 65.0 in | Wt 143.0 lb

## 2022-06-05 DIAGNOSIS — G4710 Hypersomnia, unspecified: Secondary | ICD-10-CM | POA: Diagnosis not present

## 2022-06-05 DIAGNOSIS — L900 Lichen sclerosus et atrophicus: Secondary | ICD-10-CM

## 2022-06-05 DIAGNOSIS — F5103 Paradoxical insomnia: Secondary | ICD-10-CM

## 2022-06-05 DIAGNOSIS — N951 Menopausal and female climacteric states: Secondary | ICD-10-CM

## 2022-06-05 NOTE — Telephone Encounter (Signed)
HST- [employer] no auth req   Patient is scheduled at [facility] at 9:30 AM.

## 2022-06-05 NOTE — Patient Instructions (Signed)
Lichen Sclerosus Lichen sclerosus is a skin problem. It can happen on any part of the body, but it commonly involves the anal and genital areas. It can cause itching and discomfort in these areas. Treatment can help to control symptoms. When the genital area is affected, getting treatment is important because the condition can cause scarring that may lead to other problems if left untreated. What are the causes? The cause of this condition is not known. It may be related to an overactive immune system or a lack of certain hormones. Lichen sclerosus is not an infection or a fungus, and it is not passed from one person to another (non-contagious). What increases the risk? The following factors may make you more likely to develop this condition: You are a woman who has reached menopause. You are a man who was not circumcised. This condition may also develop for the first time in children, usually before they enter puberty. What are the signs or symptoms? Symptoms of this condition include: White areas (plaques) on the skin that may be thin and wrinkled, or thickened. Red and swollen patches (lesions) on the skin. Tears or cracks in the skin. Bruising. Blood blisters. Severe itching. Pain, itching, or burning when urinating. Constipation is also common in children with lichen sclerosus, but can be seen in adults. How is this diagnosed? This condition may be diagnosed with a physical exam. In some cases, a tissue sample may be removed to be checked under a microscope (biopsy). How is this treated? This condition may be treated with: Topical steroids. These are medicated creams or ointments that are applied over the affected areas. Medicines that are taken by mouth. Topical immunotherapy. These are medicated creams or ointments that are applied over the affected areas. They stimulate your immune system to fight the skin condition. This may be used if steroids are not effective. Surgery. This may  be needed in more severe cases that are causing problems such as scarring. Follow these instructions at home: Medicines Take over-the-counter and prescription medicines only as told by your health care provider. Use creams or ointments as told by your health care provider. Skin care Do not scratch the affected areas of skin. If you are a woman, be sure to keep the vaginal area as clean and dry as possible. Clean the affected area of skin gently with water only. Pat skin dry and avoid the use of rough towels or toilet paper. Avoid irritating skin products, including soap and scented lotions. Use emollient creams as directed by your health care provider to help reduce itching. General instructions Keep all follow-up visits. This is important. Your condition may cause constipation. To prevent or treat constipation, you may need to: Drink enough fluid to keep your urine pale yellow. Take over-the-counter or prescription medicines. Eat foods that are high in fiber, such as beans, whole grains, and fresh fruits and vegetables. Limit foods that are high in fat and processed sugars, such as fried or sweet foods. Contact a health care provider if: You have increasing redness, swelling, or pain in the affected area. You have fluid, blood, or pus coming from the affected area. You have new lesions on your skin. You have a fever. You have pain during sex. Get help right away if: You develop severe pain or burning in the affected areas, especially in the genital area. Summary Lichen sclerosus is a skin problem. When the genital area is affected, getting treatment is important because the condition can cause scarring that may   lead to other problems if left untreated. This condition is usually treated with medicated creams or ointments (topical steroids) that are applied over the affected areas. Take or use over-the-counter and prescription medicines only as told by your health care provider. Contact a  health care provider if you have new lesions on your skin, have pain during sex, or have increasing redness, swelling, or pain in the affected area. Keep all follow-up visits. This is important. This information is not intended to replace advice given to you by your health care provider. Make sure you discuss any questions you have with your health care provider. Document Revised: 05/17/2019 Document Reviewed: 05/17/2019 Elsevier Patient Education  2023 [employer]  

## 2022-06-05 NOTE — Progress Notes (Signed)
GYNECOLOGY  VISIT   HPI: 67 y.o.   Divorced  Caucasian  female   [id number] with Patient's last menstrual period was 01/16/2013.   here for   4 mo recheck.  New dx of lichen sclerosus.  Has Rx for Valisone ointment.  The Valisone controls the itching quickly after placing it on the vulva. Does not need to use for 2 weeks at a time.   No symptoms today.  Will go to [place] for  the summer.   GYNECOLOGIC HISTORY: Patient's last menstrual period was 01/16/2013. 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   Menopausal syndrome (hot flushes) 05/24/2022   Chronic left hip pain 03/31/2016   Tendonosis 03/31/2016   Hamstring tendonitis at origin 11/06/2014   Ischial bursitis of left side 11/06/2014   Lumbar radiculopathy 11/06/2014   Neck pain 07/31/2013   Post menopausal syndrome 07/31/2013    Past Medical History:  Diagnosis Date   Fibroid    Lichen sclerosus of vulva    Postmenopausal bleeding    Urticaria     Past Surgical History:  Procedure Laterality Date   DILATATION & CURETTAGE/HYSTEROSCOPY WITH MYOSURE N/A 03/17/2019   Procedure: DILATATION & CURETTAGE/HYSTEROSCOPY WITH MYOSURE REMOVAL OF ENDOMETRIAL POLYP AND FIBROID/KS;  Surgeon: Patton Salles, MD;  Location: [facility] [place];  Service: Gynecology;  Laterality: N/A;  removal of endometrial polyp and fibroid/ks   DILATATION & CURRETTAGE/HYSTEROSCOPY WITH RESECTOCOPE N/A 09/11/2012   Procedure: DILATATION & CURETTAGE/HYSTEROSCOPY WITH RESECTOCOPE;  Surgeon: Melony Overly, MD;   Location: [facility];  Service: Gynecology;  Laterality: N/A;   knee surgery  1976; 2005,2004,2003   6 knee surgeries total - all right knee   WISDOM TOOTH EXTRACTION      Current Outpatient Medications  Medication Sig Dispense Refill   azelastine (ASTELIN) 0.1 % nasal spray Place 1 spray into both nostrils 2 (two) times daily as needed for rhinitis. Use in each nostril as directed 30 mL 11   betamethasone valerate ointment (VALISONE) 0.1 % Apply 1 Application topically 2 (two) times daily. Apply to skin twice a day in a thin layer to area for 2 weeks at a time for a flare.  Then use at bedtime twice a week for maintenance dosing. 45 g 1   cetirizine (ZYRTEC ALLERGY) 10 MG tablet Take 1 tablet (10 mg total) by mouth daily as needed for allergies. 30 tablet 11   cholecalciferol (VITAMIN D) 1000 UNITS tablet Take 1,000 Units by mouth daily.     EPINEPHrine (EPIPEN 2-PAK) 0.3 mg/0.3 mL IJ SOAJ injection Inject 0.3 mg into the muscle as needed for anaphylaxis. 2 each 1   Misc Natural Products (ADRENAL) CAPS Take by mouth.     Multiple Vitamin (MULTI VITAMIN  DAILY PO) Take by mouth.     tretinoin (RETIN-A) 0.025 % cream Apply 1 application topically at bedtime.     Zinc 50 MG CAPS      No current facility-administered medications for this visit.     ALLERGIES: Sodium hyaluronate, Cross-linked hyaluronate, Crab (diagnostic), Other, and Shrimp [shellfish allergy]  Family History  Problem Relation Age of Onset   Thyroid disease Mother    Hypertension Mother    Diabetes Father    Heart disease Father    Liver disease Father    Lymphoma Father     Social History   Socioeconomic History   Marital status: Divorced    Spouse name: Not on file   Number of children: Not on file   Years of education: Not on file   Highest education level: Not on file  Occupational History   Not on file  Tobacco Use   Smoking status: Never    Passive exposure: Never   Smokeless tobacco: Never  Vaping Use    Vaping Use: Never used  Substance and Sexual Activity   Alcohol use: Yes    Comment: occ glass of wine   Drug use: No   Sexual activity: Yes    Partners: Male    Birth control/protection: Post-menopausal  Other Topics Concern   Not on file  Social History Narrative   Not on file   Social Determinants of Health   Financial Resource Strain: Not on file  Food Insecurity: Not on file  Transportation Needs: Not on file  Physical Activity: Not on file  Stress: Not on file  Social Connections: Not on file  Intimate Partner Violence: Not on file    Review of Systems  All other systems reviewed and are negative.   PHYSICAL EXAMINATION:    BP 128/70 (BP Location: Left Arm, Patient Position: Sitting, Cuff Size: Normal)   Pulse 76   Ht 5\' 5"  (1.651 m)   Wt 143 lb (64.9 kg)   LMP 01/16/2013   SpO2 100%   BMI 23.80 kg/m     General appearance: alert, cooperative and appears stated age   Pelvic: External genitalia:  hypopigmentation scattered on the vulva, concentrated on the perineum with minor fissure formation.               Urethra:  normal appearing urethra with no masses, tenderness or lesions               Chaperone was present for exam:  Warren Lacy, CMA  ASSESSMENT  Lichen sclerosus.  Doing well overall.   PLAN  Lichen sclerosus reviewed and written information given.  Continue use of Valisone ointment prn.  Fu for next check in 6 months, sooner if needed.   26 min  total time was spent for this patient encounter, including preparation, face-to-face counseling with the patient, coordination of care, and documentation of the encounter.

## 2022-06-05 NOTE — Telephone Encounter (Signed)
Noted  

## 2022-06-06 ENCOUNTER — Encounter: Payer: Self-pay | Admitting: Neurology

## 2022-06-06 ENCOUNTER — Ambulatory Visit: Payer: Medicare PPO | Admitting: Internal Medicine

## 2022-06-06 NOTE — Progress Notes (Signed)
     [facility]  Natasha Simmons 67 year old female.   HOME SLEEP TEST REPORT ( by Watch PAT)   STUDY DATE:  06-06-2022   ORDERING CLINICIAN: Carmen Dohmeier, MD  REFERRING CLINICIAN: Michael Badger, MD   CLINICAL INFORMATION/HISTORY: Patient was referred by her gynecologist Dr. Brook Silva on 05-24-2022. The patient had to discontinue hormone replacement therapy and relapsed into menopausal sleep interrupting symptoms hot flashes.  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  She does neither report excessive daytime sleepiness nor excessive fatigue and daytime.   Epworth sleepiness score: 2/24. FSS at 23/ 63 points.    BMI: 23.8 kg/m   Neck Circumference: 13.5'   FINDINGS:   Sleep Summary:   Total Recording Time (hours, min):     8 hours 23 minutes   Total Sleep Time (hours, min):   7 hours 18 minutes              Percent REM (%):   24.8%                                     Respiratory Indices by AASM:   Calculated pAHI (per hour):    3.6/h    /scoring with CMS criteria reveals an AHI of only 1.1/h                     REM pAHI:      11.7/h                                           NREM pAHI:    Normal REM sleep AHI 1.4/h                          Supine AHI:   The patient slept 221 minutes in supine position with an AHI of 5.6/h.   The AHI in lateral sleep position was 0/h.     The vibration sensor indicated 5.6% of total sleep time to be associated with mild snoring at a mean volume of 40 dB.                                             Oxygen Saturation Statistics:   O2 Saturation Range (%):    Between a nadir at 85% and a maximum saturation at 99% with a mean saturation of 95%.                                    O2 Saturation (minutes) <89%: 0 minutes         Pulse Rate Statistics:   Heart rate:    Varied between 48 and 102 bpm with a mean heart rate of 60 bpm                          IMPRESSION:  This HST did not indicate the presence of sleep apnea, sleep hypoxia.  The highest variability of heart rate during sleep was seen in REM sleep, and was not associated with low oxygen saturation.  RECOMMENDATION: No physiologic sleep disorder was identified by home sleep test.    INTERPRETING PHYSICIAN:   Carmen Dohmeier, MD [facility].             

## 2022-06-06 NOTE — Procedures (Signed)
[facility]  Natasha Simmons 67 year old female.   HOME SLEEP TEST REPORT ( by Watch PAT)   STUDY DATE:  06-06-2022   ORDERING CLINICIAN: Melvyn Novas, MD  REFERRING CLINICIAN: Antony Haste, MD   CLINICAL INFORMATION/HISTORY: Patient was referred by her gynecologist Dr. Conley Simmonds on 05-24-2022. The patient had to discontinue hormone replacement therapy and relapsed into menopausal sleep interrupting symptoms hot flashes.  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  She does neither report excessive daytime sleepiness nor excessive fatigue and daytime.   Epworth sleepiness score: 2/24. FSS at 23/ 63 points.    BMI: 23.8 kg/m   Neck Circumference: 13.5'   FINDINGS:   Sleep Summary:   Total Recording Time (hours, min):     8 hours 23 minutes   Total Sleep Time (hours, min):   7 hours 18 minutes              Percent REM (%):   24.8%                                     Respiratory Indices by AASM:   Calculated pAHI (per hour):    3.6/h    /scoring with CMS criteria reveals an AHI of only 1.1/h                     REM pAHI:      11.7/h                                           NREM pAHI:    Normal REM sleep AHI 1.4/h                          Supine AHI:   The patient slept 221 minutes in supine position with an AHI of 5.6/h.   The AHI in lateral sleep position was 0/h.     The vibration sensor indicated 5.6% of total sleep time to be associated with mild snoring at a mean volume of 40 dB.                                             Oxygen Saturation Statistics:   O2 Saturation Range (%):    Between a nadir at 85% and a maximum saturation at 99% with a mean saturation of 95%.                                    O2 Saturation (minutes) <89%: 0 minutes         Pulse Rate Statistics:   Heart rate:    Varied between 48 and 102 bpm with a mean heart rate of 60 bpm  IMPRESSION:  This HST did not indicate the presence of sleep apnea, sleep hypoxia.  The highest variability of heart rate during sleep was seen in REM sleep, and was not associated with low oxygen saturation.  RECOMMENDATION: No physiologic sleep disorder was identified by home sleep test.    INTERPRETING PHYSICIAN:   Melvyn Novas, MD [facility].

## 2022-12-06 ENCOUNTER — Other Ambulatory Visit: Payer: Self-pay | Admitting: Family Medicine

## 2022-12-06 DIAGNOSIS — Z1231 Encounter for screening mammogram for malignant neoplasm of breast: Secondary | ICD-10-CM

## 2023-01-02 ENCOUNTER — Other Ambulatory Visit (HOSPITAL_COMMUNITY): Payer: Self-pay | Admitting: Orthopedic Surgery

## 2023-01-02 DIAGNOSIS — M5431 Sciatica, right side: Secondary | ICD-10-CM

## 2023-01-02 DIAGNOSIS — M48062 Spinal stenosis, lumbar region with neurogenic claudication: Secondary | ICD-10-CM

## 2023-01-12 ENCOUNTER — Ambulatory Visit (HOSPITAL_COMMUNITY): Payer: Medicare PPO

## 2023-01-12 ENCOUNTER — Encounter (HOSPITAL_COMMUNITY): Payer: Self-pay

## 2023-01-24 ENCOUNTER — Encounter: Payer: Self-pay | Admitting: Obstetrics and Gynecology

## 2023-01-25 ENCOUNTER — Ambulatory Visit
Admission: RE | Admit: 2023-01-25 | Discharge: 2023-01-25 | Disposition: A | Discharge status: Home / Self Care | Payer: Medicare PPO | Source: Ambulatory Visit | Attending: Family Medicine | Admitting: Family Medicine

## 2023-01-25 DIAGNOSIS — Z1231 Encounter for screening mammogram for malignant neoplasm of breast: Secondary | ICD-10-CM

## 2023-01-26 ENCOUNTER — Encounter: Payer: Self-pay | Admitting: Obstetrics and Gynecology

## 2023-01-29 NOTE — Telephone Encounter (Signed)
 Dr. Edward Jolly -I do not see any BMD results on file. Have you received any?

## 2023-02-12 NOTE — Progress Notes (Signed)
68 y.o. G0P0000 Divorced Caucasian female here for a breast and pelvic exam.    The patient is also followed for lichen sclerosus.  Has rx for Valisone ointment and is using is periodically, once every 3 weeks.   Hot flashes are not as frequent or as severe.   Will have right knee replacement.   Mother just turned 19 yo. Lives in [place] for the winter.   PCP: Ladora Daniel, PA-C   Patient's last menstrual period was 01/16/2013.           Sexually active: Yes.    The current method of family planning is post menopausal status.    Menopausal hormone therapy:  n/a Exercising: Yes.     Weight lifting, elliptical, walking Smoker:  no  OB History     Gravida  0   Para  0   Term  0   Preterm  0   AB  0   Living  0      SAB  0   IAB  0   Ectopic  0   Multiple  0   Live Births              HEALTH MAINTENANCE: Last 2 paps: 12/22/20 neg: HR HPV neg, 02/26/19 neg: HR HPV neg History of abnormal Pap or positive HPV:  no Mammogram:  01/25/23 Breast Density Cat C, BI-RADS CAT 1 neg Colonoscopy:  12/10/19 Bone Density:  01/04/2023 [facility]: osteopenia of spine and right hip, FRAX 8.7%/1%  Immunization History  Administered Date(s) Administered   Influenza Split 10/23/2012   Influenza, Seasonal, Injecte, Preservative Fre 10/22/2013, 10/15/2014, 10/13/2015, 10/11/2016   Influenza-Unspecified 10/22/2013, 10/22/2017, 10/30/2018   Moderna Covid-19 Vaccine Bivalent Booster 81yrs & up 11/28/2020   Moderna SARS-COV2 Booster Vaccination 06/18/2020   Moderna Sars-Covid-2 Vaccination 04/02/2019, 05/02/2019, 11/29/2019   Tdap 01/17/2005, 12/23/2015   Tetanus 01/17/2000, 01/16/2005   Zoster Recombinant(Shingrix) 02/10/2019, 06/03/2019      reports that she has never smoked. She has never been exposed to tobacco smoke. She has never used smokeless tobacco. She reports current alcohol use. She reports that she does not use drugs.  Past Medical History:  Diagnosis Date   Fibroid     Lichen sclerosus of vulva    Postmenopausal bleeding    Urticaria     Past Surgical History:  Procedure Laterality Date   DILATATION & CURETTAGE/HYSTEROSCOPY WITH MYOSURE N/A 03/17/2019   Procedure: DILATATION & CURETTAGE/HYSTEROSCOPY WITH MYOSURE REMOVAL OF ENDOMETRIAL POLYP AND FIBROID/KS;  Surgeon: Patton Salles, MD;  Location: [facility];  Service: Gynecology;  Laterality: N/A;  removal of endometrial polyp and fibroid/ks   DILATATION & CURRETTAGE/HYSTEROSCOPY WITH RESECTOCOPE N/A 09/11/2012   Procedure: DILATATION & CURETTAGE/HYSTEROSCOPY WITH RESECTOCOPE;  Surgeon: Melony Overly, MD;  Location: [facility];  Service: Gynecology;  Laterality: N/A;   knee surgery  1976; 2005,2004,2003   6 knee surgeries total - all right knee   WISDOM TOOTH EXTRACTION      Current Outpatient Medications  Medication Sig Dispense Refill   Cholecalciferol (VITAMIN D-3) 25 MCG (1000 UT) CAPS Take by mouth.     EPINEPHrine (EPIPEN 2-PAK) 0.3 mg/0.3 mL IJ SOAJ injection Inject 0.3 mg into the muscle as needed for anaphylaxis. 2 each 1   Multiple Vitamin (MULTI VITAMIN DAILY PO) Take by mouth.     tretinoin (RETIN-A) 0.025 % cream Apply 1 application topically at bedtime.     betamethasone valerate ointment (VALISONE) 0.1 % Apply 1  Application topically 2 (two) times daily. Apply to skin twice a day in a thin layer to area for 2 weeks at a time for a flare.  Then use at bedtime twice a week for maintenance dosing. 45 g 1   celecoxib (CELEBREX) 200 MG capsule Take by mouth 2 (two) times daily. (Patient not taking: Reported on 02/26/2023)     gabapentin (NEURONTIN) 300 MG capsule Take 300 mg by mouth daily. (Patient not taking: Reported on 02/26/2023)     ondansetron (ZOFRAN-ODT) 4 MG disintegrating tablet Take by mouth. (Patient not taking: Reported on 02/26/2023)     oxyCODONE (OXY IR/ROXICODONE) 5 MG immediate release tablet Take by mouth. (Patient not taking: Reported on 02/26/2023)      No current facility-administered medications for this visit.    ALLERGIES: Sodium hyaluronate (non-avian), Cross-linked hyaluronate, Crab (diagnostic), Other, and Shrimp [shellfish allergy]  Family History  Problem Relation Age of Onset   Thyroid disease Mother    Hypertension Mother    Diabetes Father    Heart disease Father    Liver disease Father    Lymphoma Father     Review of Systems  All other systems reviewed and are negative.   PHYSICAL EXAM:  BP 128/86 (BP Location: Left Arm, Patient Position: Sitting, Cuff Size: Small)   Pulse 84   Ht 5' 6.75" (1.695 m)   Wt 142 lb (64.4 kg)   LMP 01/16/2013   SpO2 99%   BMI 22.41 kg/m     General appearance: alert, cooperative and appears stated age Head: normocephalic, without obvious abnormality, atraumatic Neck: no adenopathy, supple, symmetrical, trachea midline and thyroid normal to inspection and palpation Lungs: clear to auscultation bilaterally Breasts: normal appearance, no masses or tenderness, No nipple retraction or dimpling, No nipple discharge or bleeding, No axillary adenopathy Heart: regular rate and rhythm Abdomen: soft, non-tender; no masses, no organomegaly Extremities: extremities normal, atraumatic, no cyanosis or edema Skin: skin color, texture, turgor normal. No rashes or lesions Lymph nodes: cervical, supraclavicular, and axillary nodes normal. Neurologic: grossly normal  Pelvic: External genitalia:  areas of hypopigmentation scattered around the vulva.  Some minor agglutination of the superior labia minora to the labia majora, perineum with increased white coloration of the epithelium and no specific lesions.               No abnormal inguinal nodes palpated.              Urethra:  normal appearing urethra with no masses, tenderness or lesions              Bartholins and Skenes: normal                 Vagina: normal appearing vagina with normal color and discharge, no lesions              Cervix:  no lesions              Pap taken: Yes.   Bimanual Exam:  Uterus:  normal size, contour, position, consistency, mobility, non-tender              Adnexa: no mass, fullness, tenderness              Rectal exam: Yes.  .  Confirms.              Anus:  normal sphincter tone, no lesions  Chaperone was present for exam:  Warren Lacy, CMA  ASSESSMENT: Encounter for breast and pelvic exam.  Lichens sclerosus.  Osteopenia. Multiple sites.  PLAN: Mammogram screening discussed. Self breast awareness reviewed. Pap and HRV collected:  no.  Due in 2027.  Guidelines for Calcium, Vitamin D, regular exercise program including cardiovascular and weight bearing exercise. Medication refills:  Valisone.  I encouraged once a week use to the labia minora and perineum.  Labs and vaccines with PCP.  Next BMD in 12/2024.  Follow up:  1 year for office visit and follow up prn.    Additional counseling given.  yes. 30 min  total time was spent for this patient encounter, including preparation, face-to-face counseling with the patient, coordination of care, and documentation of the encounter regarding lichen sclerosus and osteopenia in addition to doing the breast and pelvic exam.

## 2023-02-26 ENCOUNTER — Encounter: Payer: Self-pay | Admitting: Obstetrics and Gynecology

## 2023-02-26 ENCOUNTER — Ambulatory Visit (INDEPENDENT_AMBULATORY_CARE_PROVIDER_SITE_OTHER): Payer: Medicare PPO | Admitting: Obstetrics and Gynecology

## 2023-02-26 VITALS — BP 128/86 | HR 84 | Ht 66.75 in | Wt 142.0 lb

## 2023-02-26 DIAGNOSIS — M8589 Other specified disorders of bone density and structure, multiple sites: Secondary | ICD-10-CM

## 2023-02-26 DIAGNOSIS — L900 Lichen sclerosus et atrophicus: Secondary | ICD-10-CM | POA: Diagnosis not present

## 2023-02-26 DIAGNOSIS — Z01419 Encounter for gynecological examination (general) (routine) without abnormal findings: Secondary | ICD-10-CM

## 2023-02-26 MED ORDER — BETAMETHASONE VALERATE 0.1 % EX OINT: 1.0000 | TOPICAL_OINTMENT | Freq: Two times a day (BID) | CUTANEOUS | 1 refills | Status: AC

## 2023-02-28 NOTE — Patient Instructions (Signed)

## 2023-03-29 ENCOUNTER — Encounter: Payer: Medicare PPO | Admitting: Obstetrics and Gynecology

## 2023-10-23 ENCOUNTER — Encounter: Payer: Self-pay | Admitting: Obstetrics and Gynecology

## 2023-11-13 ENCOUNTER — Other Ambulatory Visit: Payer: Self-pay | Admitting: Physician Assistant

## 2023-11-13 DIAGNOSIS — Z1231 Encounter for screening mammogram for malignant neoplasm of breast: Secondary | ICD-10-CM

## 2024-01-29 ENCOUNTER — Ambulatory Visit
Admission: RE | Admit: 2024-01-29 | Discharge: 2024-01-29 | Disposition: A | Source: Ambulatory Visit | Attending: Physician Assistant

## 2024-01-29 DIAGNOSIS — Z1231 Encounter for screening mammogram for malignant neoplasm of breast: Secondary | ICD-10-CM

## 2024-03-04 ENCOUNTER — Ambulatory Visit: Payer: Medicare PPO | Admitting: Obstetrics and Gynecology
